# Patient Record
Sex: Female | Born: 1985
Health system: Southern US, Community
[De-identification: ages and names within clinical notes are randomized; demographics above are authoritative.]

## PROBLEM LIST (undated history)

## (undated) DIAGNOSIS — F419 Anxiety disorder, unspecified: Secondary | ICD-10-CM

## (undated) DIAGNOSIS — Z87442 Personal history of urinary calculi: Secondary | ICD-10-CM

## (undated) DIAGNOSIS — F329 Major depressive disorder, single episode, unspecified: Secondary | ICD-10-CM

## (undated) DIAGNOSIS — Z9889 Other specified postprocedural states: Secondary | ICD-10-CM

## (undated) DIAGNOSIS — R112 Nausea with vomiting, unspecified: Secondary | ICD-10-CM

## (undated) DIAGNOSIS — Z960 Presence of urogenital implants: Secondary | ICD-10-CM

## (undated) DIAGNOSIS — Z8489 Family history of other specified conditions: Secondary | ICD-10-CM

## (undated) DIAGNOSIS — R3915 Urgency of urination: Secondary | ICD-10-CM

## (undated) DIAGNOSIS — G4700 Insomnia, unspecified: Secondary | ICD-10-CM

## (undated) DIAGNOSIS — R300 Dysuria: Secondary | ICD-10-CM

## (undated) DIAGNOSIS — N201 Calculus of ureter: Secondary | ICD-10-CM

## (undated) DIAGNOSIS — R350 Frequency of micturition: Secondary | ICD-10-CM

## (undated) DIAGNOSIS — F32A Depression, unspecified: Secondary | ICD-10-CM

## (undated) DIAGNOSIS — I10 Essential (primary) hypertension: Secondary | ICD-10-CM

## (undated) HISTORY — PX: WISDOM TOOTH EXTRACTION: SHX21

## (undated) HISTORY — DX: Insomnia, unspecified: G47.00

## (undated) HISTORY — DX: Depression, unspecified: F32.A

## (undated) HISTORY — DX: Anxiety disorder, unspecified: F41.9

## (undated) HISTORY — DX: Essential (primary) hypertension: I10

## (undated) HISTORY — DX: Major depressive disorder, single episode, unspecified: F32.9

---
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
## 2003-12-08 ENCOUNTER — Other Ambulatory Visit: Admission: RE | Admit: 2003-12-08 | Discharge: 2003-12-08 | Payer: Self-pay | Admitting: Family Medicine

## 2005-01-22 ENCOUNTER — Other Ambulatory Visit: Admission: RE | Admit: 2005-01-22 | Discharge: 2005-01-22 | Payer: Self-pay | Admitting: *Deleted

## 2006-03-17 ENCOUNTER — Other Ambulatory Visit: Admission: RE | Admit: 2006-03-17 | Discharge: 2006-03-17 | Payer: Self-pay | Admitting: *Deleted

## 2006-07-29 HISTORY — PX: BREAST ENHANCEMENT SURGERY: SHX7

## 2008-07-29 DIAGNOSIS — Z87442 Personal history of urinary calculi: Secondary | ICD-10-CM

## 2008-07-29 HISTORY — DX: Personal history of urinary calculi: Z87.442

## 2009-04-18 ENCOUNTER — Emergency Department (HOSPITAL_COMMUNITY): Admission: EM | Admit: 2009-04-18 | Discharge: 2009-04-18 | Payer: Self-pay | Admitting: Emergency Medicine

## 2009-04-18 ENCOUNTER — Other Ambulatory Visit: Admission: RE | Admit: 2009-04-18 | Discharge: 2009-04-18 | Payer: Self-pay | Admitting: Family Medicine

## 2009-06-19 ENCOUNTER — Emergency Department (HOSPITAL_BASED_OUTPATIENT_CLINIC_OR_DEPARTMENT_OTHER): Admission: EM | Admit: 2009-06-19 | Discharge: 2009-06-19 | Payer: Self-pay | Admitting: Emergency Medicine

## 2009-06-19 ENCOUNTER — Ambulatory Visit: Payer: Self-pay | Admitting: Diagnostic Radiology

## 2009-11-03 ENCOUNTER — Emergency Department (HOSPITAL_BASED_OUTPATIENT_CLINIC_OR_DEPARTMENT_OTHER): Admission: EM | Admit: 2009-11-03 | Discharge: 2009-11-03 | Payer: Self-pay | Admitting: Emergency Medicine

## 2009-12-26 ENCOUNTER — Emergency Department (HOSPITAL_COMMUNITY): Admission: EM | Admit: 2009-12-26 | Discharge: 2009-12-26 | Payer: Self-pay | Admitting: Emergency Medicine

## 2010-02-04 ENCOUNTER — Emergency Department (HOSPITAL_COMMUNITY): Admission: EM | Admit: 2010-02-04 | Discharge: 2010-02-05 | Payer: Self-pay | Admitting: Emergency Medicine

## 2010-02-05 ENCOUNTER — Inpatient Hospital Stay (HOSPITAL_COMMUNITY): Admission: AD | Admit: 2010-02-05 | Discharge: 2010-02-08 | Payer: Self-pay | Admitting: Psychiatry

## 2010-02-05 ENCOUNTER — Ambulatory Visit: Payer: Self-pay | Admitting: Psychiatry

## 2010-06-09 ENCOUNTER — Ambulatory Visit: Payer: Self-pay | Admitting: Diagnostic Radiology

## 2010-06-09 ENCOUNTER — Emergency Department (HOSPITAL_BASED_OUTPATIENT_CLINIC_OR_DEPARTMENT_OTHER): Admission: EM | Admit: 2010-06-09 | Discharge: 2010-06-09 | Payer: Self-pay | Admitting: Emergency Medicine

## 2010-06-10 ENCOUNTER — Emergency Department (HOSPITAL_BASED_OUTPATIENT_CLINIC_OR_DEPARTMENT_OTHER): Admission: EM | Admit: 2010-06-10 | Discharge: 2010-06-11 | Payer: Self-pay | Admitting: Emergency Medicine

## 2010-08-26 ENCOUNTER — Emergency Department (INDEPENDENT_AMBULATORY_CARE_PROVIDER_SITE_OTHER)
Admission: EM | Admit: 2010-08-26 | Discharge: 2010-08-26 | Payer: Self-pay | Source: Home / Self Care | Admitting: Emergency Medicine

## 2010-08-26 DIAGNOSIS — R109 Unspecified abdominal pain: Secondary | ICD-10-CM

## 2010-08-26 DIAGNOSIS — R112 Nausea with vomiting, unspecified: Secondary | ICD-10-CM

## 2010-08-26 LAB — BASIC METABOLIC PANEL
Calcium: 8.6 mg/dL (ref 8.4–10.5)
Creatinine, Ser: 0.6 mg/dL (ref 0.4–1.2)
GFR calc Af Amer: >60 mL/min (ref >60)
GFR calc non Af Amer: >60 mL/min (ref >60)

## 2010-08-26 LAB — URINALYSIS, ROUTINE W REFLEX MICROSCOPIC
Ketones, ur: NEGATIVE mg/dL
Nitrite: NEGATIVE
Protein, ur: NEGATIVE mg/dL

## 2010-08-26 LAB — PREGNANCY, URINE: Preg Test, Ur: NEGATIVE

## 2010-10-09 LAB — URINALYSIS, ROUTINE W REFLEX MICROSCOPIC
Bilirubin Urine: NEGATIVE
Glucose, UA: NEGATIVE mg/dL
Hgb urine dipstick: NEGATIVE
Hgb urine dipstick: NEGATIVE
Protein, ur: NEGATIVE mg/dL
Specific Gravity, Urine: 1.006 (ref 1.005–1.030)
Urobilinogen, UA: 0.2 mg/dL (ref 0.0–1.0)
pH: 6.5 (ref 5.0–8.0)

## 2010-10-09 LAB — CBC
Hemoglobin: 13.5 g/dL (ref 12.0–15.0)
Hemoglobin: 14.6 g/dL (ref 12.0–15.0)
MCH: 31.5 pg (ref 26.0–34.0)
MCH: 31.6 pg (ref 26.0–34.0)
MCHC: 35.3 g/dL (ref 30.0–36.0)
RBC: 4.64 MIL/uL (ref 3.87–5.11)
RDW: 12.4 % (ref 11.5–15.5)
WBC: 13.5 10*3/uL — ABNORMAL HIGH (ref 4.0–10.5)

## 2010-10-09 LAB — URINE MICROSCOPIC-ADD ON

## 2010-10-09 LAB — BASIC METABOLIC PANEL
BUN: 20 mg/dL (ref 6–23)
CO2: 24 mEq/L (ref 19–32)
CO2: 25 mEq/L (ref 19–32)
Calcium: 9.3 mg/dL (ref 8.4–10.5)
Calcium: 9.6 mg/dL (ref 8.4–10.5)
GFR calc Af Amer: >60 mL/min (ref >60)
GFR calc non Af Amer: >60 mL/min (ref >60)
Glucose, Bld: 91 mg/dL (ref 70–99)
Sodium: 144 mEq/L (ref 135–145)

## 2010-10-09 LAB — DIFFERENTIAL
Basophils Absolute: 0.2 10*3/uL — ABNORMAL HIGH (ref 0.0–0.1)
Basophils Relative: 1 % (ref 0–1)
Eosinophils Absolute: 0.1 10*3/uL (ref 0.0–0.7)
Eosinophils Absolute: 0.2 10*3/uL (ref 0.0–0.7)
Lymphocytes Relative: 12 % (ref 12–46)
Lymphocytes Relative: 17 % (ref 12–46)
Lymphs Abs: 2.3 10*3/uL (ref 0.7–4.0)
Monocytes Absolute: 1.9 10*3/uL — ABNORMAL HIGH (ref 0.1–1.0)
Monocytes Relative: 8 % (ref 3–12)
Neutro Abs: 14.2 10*3/uL — ABNORMAL HIGH (ref 1.7–7.7)
Neutro Abs: 9.8 10*3/uL — ABNORMAL HIGH (ref 1.7–7.7)
Neutrophils Relative %: 73 % (ref 43–77)
Neutrophils Relative %: 76 % (ref 43–77)

## 2010-10-09 LAB — URINE CULTURE
Colony Count: NO GROWTH
Culture  Setup Time: 201111142133
Culture: NO GROWTH

## 2010-10-09 LAB — MONONUCLEOSIS SCREEN: Mono Screen: NEGATIVE

## 2010-10-14 LAB — DIFFERENTIAL
Basophils Absolute: 0 10*3/uL (ref 0.0–0.1)
Basophils Relative: 0 % (ref 0–1)
Eosinophils Absolute: 0.1 10*3/uL (ref 0.0–0.7)
Eosinophils Relative: 0 % (ref 0–5)
Monocytes Absolute: 0.9 10*3/uL (ref 0.1–1.0)

## 2010-10-14 LAB — URINALYSIS, ROUTINE W REFLEX MICROSCOPIC
Glucose, UA: NEGATIVE mg/dL
Protein, ur: NEGATIVE mg/dL
Specific Gravity, Urine: 1.014 (ref 1.005–1.030)
pH: 7 (ref 5.0–8.0)

## 2010-10-14 LAB — BASIC METABOLIC PANEL
BUN: 9 mg/dL (ref 6–23)
Chloride: 105 mEq/L (ref 96–112)
Creatinine, Ser: 0.75 mg/dL (ref 0.4–1.2)
Glucose, Bld: 93 mg/dL (ref 70–99)
Potassium: 3.8 mEq/L (ref 3.5–5.1)

## 2010-10-14 LAB — CBC
HCT: 43.4 % (ref 36.0–46.0)
MCH: 33.2 pg (ref 26.0–34.0)
MCHC: 35.6 g/dL (ref 30.0–36.0)
MCV: 93.1 fL (ref 78.0–100.0)
Platelets: 560 10*3/uL — ABNORMAL HIGH (ref 150–400)
RDW: 12.5 % (ref 11.5–15.5)
WBC: 11.7 10*3/uL — ABNORMAL HIGH (ref 4.0–10.5)

## 2010-10-14 LAB — PREGNANCY, URINE: Preg Test, Ur: NEGATIVE

## 2010-10-14 LAB — RAPID URINE DRUG SCREEN, HOSP PERFORMED
Barbiturates: NOT DETECTED
Benzodiazepines: NOT DETECTED

## 2010-10-14 LAB — URINE MICROSCOPIC-ADD ON

## 2010-10-14 LAB — ETHANOL: Alcohol, Ethyl (B): 5 mg/dL (ref 0–10)

## 2010-10-15 LAB — POCT I-STAT, CHEM 8
Chloride: 112 mEq/L (ref 96–112)
Creatinine, Ser: 0.6 mg/dL (ref 0.4–1.2)
Glucose, Bld: 119 mg/dL — ABNORMAL HIGH (ref 70–99)
HCT: 46 % (ref 36.0–46.0)
Hemoglobin: 15.6 g/dL — ABNORMAL HIGH (ref 12.0–15.0)
Potassium: 4 mEq/L (ref 3.5–5.1)
Sodium: 143 mEq/L (ref 135–145)

## 2010-10-15 LAB — URINALYSIS, ROUTINE W REFLEX MICROSCOPIC
Glucose, UA: NEGATIVE mg/dL
Hgb urine dipstick: NEGATIVE
Specific Gravity, Urine: 1.025 (ref 1.005–1.030)
pH: 5.5 (ref 5.0–8.0)

## 2010-10-15 LAB — URINE MICROSCOPIC-ADD ON

## 2010-10-15 LAB — CBC
Hemoglobin: 15.1 g/dL — ABNORMAL HIGH (ref 12.0–15.0)
MCHC: 34.6 g/dL (ref 30.0–36.0)
RBC: 4.64 MIL/uL (ref 3.87–5.11)
RDW: 12.9 % (ref 11.5–15.5)

## 2010-10-15 LAB — RAPID URINE DRUG SCREEN, HOSP PERFORMED: Tetrahydrocannabinol: POSITIVE — AB

## 2010-10-15 LAB — ETHANOL: Alcohol, Ethyl (B): <5 mg/dL (ref 0–10)

## 2010-10-15 LAB — POCT PREGNANCY, URINE: Preg Test, Ur: NEGATIVE

## 2010-10-17 LAB — URINE CULTURE: Colony Count: NO GROWTH

## 2010-10-17 LAB — URINE MICROSCOPIC-ADD ON

## 2010-10-17 LAB — BASIC METABOLIC PANEL
BUN: 7 mg/dL (ref 6–23)
CO2: 22 mEq/L (ref 19–32)
Calcium: 9.1 mg/dL (ref 8.4–10.5)
Chloride: 107 mEq/L (ref 96–112)
Creatinine, Ser: 0.7 mg/dL (ref 0.4–1.2)
Glucose, Bld: 86 mg/dL (ref 70–99)

## 2010-10-17 LAB — CBC
MCHC: 34.6 g/dL (ref 30.0–36.0)
MCV: 92.1 fL (ref 78.0–100.0)
Platelets: 387 10*3/uL (ref 150–400)
RBC: 4.33 MIL/uL (ref 3.87–5.11)
RDW: 11.4 % — ABNORMAL LOW (ref 11.5–15.5)

## 2010-10-17 LAB — DIFFERENTIAL
Basophils Absolute: 0 10*3/uL (ref 0.0–0.1)
Basophils Relative: 0 % (ref 0–1)
Eosinophils Absolute: 0 10*3/uL (ref 0.0–0.7)
Monocytes Relative: 6 % (ref 3–12)
Neutro Abs: 9.4 10*3/uL — ABNORMAL HIGH (ref 1.7–7.7)
Neutrophils Relative %: 77 % (ref 43–77)

## 2010-10-17 LAB — URINALYSIS, ROUTINE W REFLEX MICROSCOPIC
Hgb urine dipstick: NEGATIVE
Protein, ur: 30 mg/dL — AB
Specific Gravity, Urine: 1.022 (ref 1.005–1.030)
Urobilinogen, UA: 0.2 mg/dL (ref 0.0–1.0)

## 2010-10-31 LAB — PREGNANCY, URINE: Preg Test, Ur: NEGATIVE

## 2010-10-31 LAB — URINE CULTURE

## 2010-10-31 LAB — URINALYSIS, ROUTINE W REFLEX MICROSCOPIC
Bilirubin Urine: NEGATIVE
Glucose, UA: NEGATIVE mg/dL
Nitrite: NEGATIVE
Specific Gravity, Urine: 1.019 (ref 1.005–1.030)
pH: 7 (ref 5.0–8.0)

## 2010-10-31 LAB — URINE MICROSCOPIC-ADD ON

## 2010-11-02 LAB — DIFFERENTIAL
Eosinophils Absolute: 0 10*3/uL (ref 0.0–0.7)
Eosinophils Relative: 0 % (ref 0–5)
Lymphocytes Relative: 2 % — ABNORMAL LOW (ref 12–46)
Lymphs Abs: 0.4 10*3/uL — ABNORMAL LOW (ref 0.7–4.0)
Monocytes Relative: 1 % — ABNORMAL LOW (ref 3–12)

## 2010-11-02 LAB — CBC
HCT: 43.1 % (ref 36.0–46.0)
Hemoglobin: 15.3 g/dL — ABNORMAL HIGH (ref 12.0–15.0)
MCV: 90.7 fL (ref 78.0–100.0)
RBC: 4.75 MIL/uL (ref 3.87–5.11)
WBC: 17.1 10*3/uL — ABNORMAL HIGH (ref 4.0–10.5)

## 2010-11-02 LAB — BASIC METABOLIC PANEL
Chloride: 104 mEq/L (ref 96–112)
GFR calc Af Amer: >60 mL/min (ref >60)
GFR calc non Af Amer: >60 mL/min (ref >60)
Potassium: 3.5 mEq/L (ref 3.5–5.1)
Sodium: 137 mEq/L (ref 135–145)

## 2010-11-02 LAB — URINE MICROSCOPIC-ADD ON

## 2010-11-02 LAB — URINALYSIS, ROUTINE W REFLEX MICROSCOPIC
Specific Gravity, Urine: 1.02 (ref 1.005–1.030)
Urobilinogen, UA: 0.2 mg/dL (ref 0.0–1.0)
pH: 7 (ref 5.0–8.0)

## 2010-11-02 LAB — PREGNANCY, URINE: Preg Test, Ur: NEGATIVE

## 2010-11-03 ENCOUNTER — Emergency Department (HOSPITAL_BASED_OUTPATIENT_CLINIC_OR_DEPARTMENT_OTHER)
Admission: EM | Admit: 2010-11-03 | Discharge: 2010-11-03 | Disposition: A | Discharge status: Home / Self Care | Payer: BC Managed Care – PPO | Attending: Emergency Medicine | Admitting: Emergency Medicine

## 2010-11-03 ENCOUNTER — Emergency Department (INDEPENDENT_AMBULATORY_CARE_PROVIDER_SITE_OTHER): Payer: BC Managed Care – PPO

## 2010-11-03 DIAGNOSIS — R319 Hematuria, unspecified: Secondary | ICD-10-CM | POA: Insufficient documentation

## 2010-11-03 DIAGNOSIS — N12 Tubulo-interstitial nephritis, not specified as acute or chronic: Secondary | ICD-10-CM | POA: Insufficient documentation

## 2010-11-03 DIAGNOSIS — F341 Dysthymic disorder: Secondary | ICD-10-CM | POA: Insufficient documentation

## 2010-11-03 DIAGNOSIS — R109 Unspecified abdominal pain: Secondary | ICD-10-CM

## 2010-11-03 DIAGNOSIS — F988 Other specified behavioral and emotional disorders with onset usually occurring in childhood and adolescence: Secondary | ICD-10-CM | POA: Insufficient documentation

## 2010-11-03 DIAGNOSIS — Z79899 Other long term (current) drug therapy: Secondary | ICD-10-CM | POA: Insufficient documentation

## 2010-11-03 LAB — CBC
Hemoglobin: 14.2 g/dL (ref 12.0–15.0)
Platelets: 424 10*3/uL — ABNORMAL HIGH (ref 150–400)
RBC: 4.57 MIL/uL (ref 3.87–5.11)
WBC: 17.2 10*3/uL — ABNORMAL HIGH (ref 4.0–10.5)

## 2010-11-03 LAB — URINALYSIS, ROUTINE W REFLEX MICROSCOPIC
Glucose, UA: NEGATIVE mg/dL
Specific Gravity, Urine: 1.027 (ref 1.005–1.030)
Urobilinogen, UA: 0.2 mg/dL (ref 0.0–1.0)

## 2010-11-03 LAB — URINE MICROSCOPIC-ADD ON

## 2010-11-03 LAB — BASIC METABOLIC PANEL
CO2: 26 mEq/L (ref 19–32)
Chloride: 105 mEq/L (ref 96–112)
GFR calc Af Amer: >60 mL/min (ref >60)
Potassium: 3.9 mEq/L (ref 3.5–5.1)
Sodium: 142 mEq/L (ref 135–145)

## 2010-11-03 LAB — DIFFERENTIAL
Basophils Absolute: 0.1 10*3/uL (ref 0.0–0.1)
Basophils Relative: 0 % (ref 0–1)
Eosinophils Absolute: 0.1 10*3/uL (ref 0.0–0.7)
Monocytes Relative: 9 % (ref 3–12)
Neutro Abs: 13.7 10*3/uL — ABNORMAL HIGH (ref 1.7–7.7)
Neutrophils Relative %: 80 % — ABNORMAL HIGH (ref 43–77)

## 2010-11-03 LAB — PREGNANCY, URINE: Preg Test, Ur: NEGATIVE

## 2010-11-05 LAB — URINE CULTURE: Culture  Setup Time: 201204080019

## 2010-12-14 NOTE — Op Note (Signed)
[facility]  Patient:    [name], [name]                        MRN: [id number] Proc. Date: 07/18/00 Adm. Date:  [id number] Attending:  Marlowe Kays Page                           Operative Report  PREOPERATIVE DIAGNOSIS:  Chronic lesion dorsum right long finger.  POSTOPERATIVE DIAGNOSIS:  Chronic lesion dorsum right long finger.  OPERATION:  Excision of lesion dorsum right long finger.  SURGEON:  Illene Labrador. [place], M.D.  ASSISTANT:  Nurse.  ANESTHESIA:  Regional.  PATHOLOGY AND JUSTIFICATION FOR PROCEDURE:  This lesion has been present for almost five months, no known injury.  She has had treatment by dermatologist without success in eradicating the lesion.  It is about 5 mm in diameter, has a verrucous appearance, and was midway between the DIP joint and the base of the nail.  I was prepared to apply a full thickness skin graft from the wrist as necessary.  DESCRIPTION OF PROCEDURE:  Satisfactory regional anesthesia, Dura-Prep from mid forearm to fingertips, draped in a sterile field.  The lesion was excised with a 7 mm elliptical excision.  I felt like I had completely excised the lesion which was superficial to the durum layer for the nail which I could see at the bottom of my excision area.  I was able to obtain a primary closure with 5-0 nylon rather than having to use a skin graft.  I blocked the finger with 1/2% plain Marcaine.  Betadine and Adaptic dry sterile dressing were applied.  At the time of this dictation, she was on her way to the recovery room in satisfactory condition with no known complications. DD:  07/18/00 TD:  07/19/00 Job: [id number]

## 2011-04-22 ENCOUNTER — Emergency Department (HOSPITAL_COMMUNITY)
Admission: EM | Admit: 2011-04-22 | Discharge: 2011-04-23 | Disposition: A | Discharge status: Other Acute Inpatient Hospital | Payer: BC Managed Care – PPO | Attending: Emergency Medicine | Admitting: Emergency Medicine

## 2011-04-22 DIAGNOSIS — R000 Tachycardia, unspecified: Secondary | ICD-10-CM | POA: Insufficient documentation

## 2011-04-22 DIAGNOSIS — F329 Major depressive disorder, single episode, unspecified: Secondary | ICD-10-CM | POA: Insufficient documentation

## 2011-04-22 DIAGNOSIS — F411 Generalized anxiety disorder: Secondary | ICD-10-CM | POA: Insufficient documentation

## 2011-04-22 DIAGNOSIS — F19939 Other psychoactive substance use, unspecified with withdrawal, unspecified: Secondary | ICD-10-CM | POA: Insufficient documentation

## 2011-04-22 DIAGNOSIS — F3289 Other specified depressive episodes: Secondary | ICD-10-CM | POA: Insufficient documentation

## 2011-04-22 DIAGNOSIS — F1120 Opioid dependence, uncomplicated: Secondary | ICD-10-CM | POA: Insufficient documentation

## 2011-04-22 DIAGNOSIS — Z79899 Other long term (current) drug therapy: Secondary | ICD-10-CM | POA: Insufficient documentation

## 2011-04-22 LAB — URINALYSIS, ROUTINE W REFLEX MICROSCOPIC
Bilirubin Urine: NEGATIVE
Glucose, UA: NEGATIVE mg/dL
Hgb urine dipstick: NEGATIVE
Ketones, ur: NEGATIVE mg/dL
Protein, ur: NEGATIVE mg/dL
pH: 6 (ref 5.0–8.0)

## 2011-04-22 LAB — COMPREHENSIVE METABOLIC PANEL
ALT: 14 U/L (ref 0–35)
AST: 18 U/L (ref 0–37)
Albumin: 4.5 g/dL (ref 3.5–5.2)
Alkaline Phosphatase: 130 U/L — ABNORMAL HIGH (ref 39–117)
CO2: 26 mEq/L (ref 19–32)
Chloride: 105 mEq/L (ref 96–112)
GFR calc non Af Amer: >60 mL/min (ref >60)
Potassium: 4.2 mEq/L (ref 3.5–5.1)
Total Bilirubin: 0.3 mg/dL (ref 0.3–1.2)

## 2011-04-22 LAB — DIFFERENTIAL
Basophils Absolute: 0.1 10*3/uL (ref 0.0–0.1)
Basophils Relative: 1 % (ref 0–1)
Eosinophils Relative: 1 % (ref 0–5)
Lymphocytes Relative: 24 % (ref 12–46)
Monocytes Absolute: 1.5 10*3/uL — ABNORMAL HIGH (ref 0.1–1.0)
Monocytes Relative: 11 % (ref 3–12)
Neutro Abs: 8.4 10*3/uL — ABNORMAL HIGH (ref 1.7–7.7)

## 2011-04-22 LAB — CBC
HCT: 46.6 % — ABNORMAL HIGH (ref 36.0–46.0)
Hemoglobin: 16.8 g/dL — ABNORMAL HIGH (ref 12.0–15.0)
MCH: 32.2 pg (ref 26.0–34.0)
MCHC: 36.1 g/dL — ABNORMAL HIGH (ref 30.0–36.0)
RDW: 12.5 % (ref 11.5–15.5)

## 2011-04-22 LAB — RAPID URINE DRUG SCREEN, HOSP PERFORMED
Barbiturates: NOT DETECTED
Cocaine: NOT DETECTED
Tetrahydrocannabinol: NOT DETECTED

## 2011-04-22 LAB — URINE MICROSCOPIC-ADD ON

## 2011-04-23 ENCOUNTER — Inpatient Hospital Stay (HOSPITAL_COMMUNITY)
Admission: RE | Admit: 2011-04-23 | Discharge: 2011-04-27 | DRG: 745 | Disposition: A | Discharge status: Home / Self Care | Payer: BC Managed Care – PPO | Source: Ambulatory Visit | Attending: Psychiatry | Admitting: Psychiatry

## 2011-04-23 DIAGNOSIS — F1920 Other psychoactive substance dependence, uncomplicated: Secondary | ICD-10-CM

## 2011-04-23 DIAGNOSIS — F1120 Opioid dependence, uncomplicated: Principal | ICD-10-CM

## 2011-04-23 DIAGNOSIS — Z880 Allergy status to penicillin: Secondary | ICD-10-CM

## 2011-04-23 DIAGNOSIS — Z87442 Personal history of urinary calculi: Secondary | ICD-10-CM

## 2011-04-23 DIAGNOSIS — Z560 Unemployment, unspecified: Secondary | ICD-10-CM

## 2011-04-23 DIAGNOSIS — F329 Major depressive disorder, single episode, unspecified: Secondary | ICD-10-CM

## 2011-04-23 DIAGNOSIS — Z6379 Other stressful life events affecting family and household: Secondary | ICD-10-CM

## 2011-04-23 DIAGNOSIS — F411 Generalized anxiety disorder: Secondary | ICD-10-CM

## 2011-04-23 DIAGNOSIS — F3289 Other specified depressive episodes: Secondary | ICD-10-CM

## 2011-04-23 DIAGNOSIS — Z882 Allergy status to sulfonamides status: Secondary | ICD-10-CM

## 2011-04-23 DIAGNOSIS — Z79899 Other long term (current) drug therapy: Secondary | ICD-10-CM

## 2011-04-23 LAB — POCT PREGNANCY, URINE: Preg Test, Ur: NEGATIVE
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
## 2012-10-08 ENCOUNTER — Ambulatory Visit (INDEPENDENT_AMBULATORY_CARE_PROVIDER_SITE_OTHER): Payer: BC Managed Care – PPO | Admitting: Family Medicine

## 2012-10-08 ENCOUNTER — Ambulatory Visit: Payer: BC Managed Care – PPO

## 2012-10-08 VITALS — BP 146/78 | HR 120 | Temp 98.6°F | Resp 16 | Ht 64.0 in | Wt 164.0 lb

## 2012-10-08 DIAGNOSIS — M549 Dorsalgia, unspecified: Secondary | ICD-10-CM

## 2012-10-08 DIAGNOSIS — S8000XA Contusion of unspecified knee, initial encounter: Secondary | ICD-10-CM

## 2012-10-08 DIAGNOSIS — M25569 Pain in unspecified knee: Secondary | ICD-10-CM

## 2012-10-08 DIAGNOSIS — M25572 Pain in left ankle and joints of left foot: Secondary | ICD-10-CM

## 2012-10-08 DIAGNOSIS — M25579 Pain in unspecified ankle and joints of unspecified foot: Secondary | ICD-10-CM

## 2012-10-08 DIAGNOSIS — S93409A Sprain of unspecified ligament of unspecified ankle, initial encounter: Secondary | ICD-10-CM

## 2012-10-08 DIAGNOSIS — M25562 Pain in left knee: Secondary | ICD-10-CM

## 2012-10-08 DIAGNOSIS — S8002XA Contusion of left knee, initial encounter: Secondary | ICD-10-CM

## 2012-10-08 MED ORDER — DICLOFENAC SODIUM 75 MG PO TBEC: 75.0000 mg | DELAYED_RELEASE_TABLET | Freq: Two times a day (BID) | ORAL | Status: DC

## 2012-10-08 NOTE — Progress Notes (Signed)
Subjective:    Patient ID: [name], female    DOB: 1986/02/26, 27 y.o.   MRN: [id number]  HPI 27 year old female presents for evaluation of left knee and ankle pain s/p a fall this morning around 9:00 a.m.  States she was on her way in to work at [employer] and tripped off of the curb and fell hitting her left knee and possible rolling her left ankle.  She denies head injury or LOC.  Admits that she did try to continue on with her work day but subsequently decided her pain was too much and that she needed to be checked out.  Is able to ambulate but has significant pain in her left knee with weight bearing. Also complains of lumbar back pain although she did not have a direct injury to her back. Denies paresthesias, weakness, incontinence, or saddle anesthesias.  Denies headache, dizziness, nausea, or vomiting.     Review of Systems  Constitutional: Negative for fever and chills.  Gastrointestinal: Negative for nausea, vomiting and abdominal pain.  Musculoskeletal: Positive for back pain, joint swelling (left knee) and arthralgias (left knee and ankle).  Neurological: Negative for dizziness and headaches.       Objective:   Physical Exam  Constitutional: She is oriented to person, place, and time. She appears well-developed and well-nourished.  HENT:  Head: Normocephalic and atraumatic.  Right Ear: External ear normal.  Eyes: Conjunctivae are normal.  Neck: Normal range of motion.  Cardiovascular: Normal rate.   Pulmonary/Chest: Effort normal.  Musculoskeletal:       Left knee: She exhibits decreased range of motion and swelling. She exhibits no ecchymosis, no deformity, no erythema, normal alignment, no LCL laxity and no bony tenderness. Tenderness found. Medial joint line and lateral joint line tenderness noted. No MCL, no LCL and no patellar tendon tenderness noted.       Left ankle: She exhibits normal range of motion, no swelling, no ecchymosis and normal pulse. Tenderness (over  anterior ankle). No lateral malleolus and no medial malleolus tenderness found. Achilles tendon normal.       Lumbar back: She exhibits decreased range of motion (secondary to pain), tenderness (bilateral sciatic tenderness) and pain. She exhibits no bony tenderness (no midline tenderness), no swelling and no deformity.       Legs: Neurological: She is alert and oriented to person, place, and time. She has normal strength.  Reflex Scores:      Patellar reflexes are 2+ on the right side and 2+ on the left side. Psychiatric: She has a normal mood and affect. Her behavior is normal. Judgment and thought content normal.     [facility] reading (PRIMARY) by  Dr. Alwyn Ren as norma lumbar spine, negative knee x-ray, and negative ankle x-ray.       Assessment & Plan:  Contusion, knee, left, initial encounter  Sprain of ankle, unspecified site  Knee pain, acute, left - Plan: DG Knee Complete 4 Views Left, diclofenac (VOLTAREN) 75 MG EC tablet  Pain in joint, ankle and foot, left - Plan: DG Ankle Complete Left  Backache - Plan: DG Lumbar Spine Complete  Patient has negative x-rays here today.  She has a contusion of her left knee as well as a left ankle sprain.  When I went back into the room to discuss her findings, she at first declined anti-inflammatory prescription but did ask for a prescription for Vicodin.  According to [place] Controlled Substance Database, patient is currently on Suboxone treatment  with Dr. Geoffery Lyons.  I refused to prescribe Narcotics for her. She then demanded a prescription for a muscle relaxer. I discussed at length with her that I did not believe a muscle relaxer was appropriate treatment for the type of injuries she had. She adamantly insisted that she would need the muscle relaxer "to sleep" and "to relax" tomorrow when she was at home from work.  I again explained this was not appropriate use of this medication.  Patient was discharged home with a knee brace and a prescription for  Diclofenac to take twice daily.

## 2012-10-08 NOTE — Progress Notes (Signed)
X-rays were carefully examined and I discussed the case with Rhoderick Moody PA . Agree with treatment and plan

## 2016-11-28 ENCOUNTER — Emergency Department (HOSPITAL_COMMUNITY): Payer: Self-pay

## 2016-11-28 ENCOUNTER — Emergency Department (HOSPITAL_COMMUNITY)
Admission: EM | Admit: 2016-11-28 | Discharge: 2016-11-28 | Disposition: A | Discharge status: Home / Self Care | Payer: Self-pay | Attending: Urology | Admitting: Urology

## 2016-11-28 ENCOUNTER — Ambulatory Visit: Payer: Self-pay | Admitting: Urology

## 2016-11-28 ENCOUNTER — Encounter (HOSPITAL_COMMUNITY)
Admission: EM | Disposition: A | Discharge status: Home / Self Care | Payer: Self-pay | Source: Home / Self Care | Attending: Emergency Medicine

## 2016-11-28 ENCOUNTER — Encounter (HOSPITAL_COMMUNITY): Payer: Self-pay

## 2016-11-28 ENCOUNTER — Emergency Department (HOSPITAL_COMMUNITY): Payer: Self-pay | Admitting: Anesthesiology

## 2016-11-28 DIAGNOSIS — G4700 Insomnia, unspecified: Secondary | ICD-10-CM | POA: Insufficient documentation

## 2016-11-28 DIAGNOSIS — N390 Urinary tract infection, site not specified: Secondary | ICD-10-CM | POA: Insufficient documentation

## 2016-11-28 DIAGNOSIS — Z793 Long term (current) use of hormonal contraceptives: Secondary | ICD-10-CM | POA: Insufficient documentation

## 2016-11-28 DIAGNOSIS — F329 Major depressive disorder, single episode, unspecified: Secondary | ICD-10-CM | POA: Insufficient documentation

## 2016-11-28 DIAGNOSIS — I10 Essential (primary) hypertension: Secondary | ICD-10-CM | POA: Insufficient documentation

## 2016-11-28 DIAGNOSIS — N200 Calculus of kidney: Secondary | ICD-10-CM

## 2016-11-28 DIAGNOSIS — N132 Hydronephrosis with renal and ureteral calculous obstruction: Secondary | ICD-10-CM | POA: Insufficient documentation

## 2016-11-28 DIAGNOSIS — Z841 Family history of disorders of kidney and ureter: Secondary | ICD-10-CM | POA: Insufficient documentation

## 2016-11-28 DIAGNOSIS — F17210 Nicotine dependence, cigarettes, uncomplicated: Secondary | ICD-10-CM | POA: Insufficient documentation

## 2016-11-28 DIAGNOSIS — Z79899 Other long term (current) drug therapy: Secondary | ICD-10-CM | POA: Insufficient documentation

## 2016-11-28 DIAGNOSIS — N23 Unspecified renal colic: Secondary | ICD-10-CM

## 2016-11-28 DIAGNOSIS — N201 Calculus of ureter: Secondary | ICD-10-CM

## 2016-11-28 HISTORY — PX: CYSTOSCOPY W/ URETERAL STENT PLACEMENT: SHX1429

## 2016-11-28 LAB — URINALYSIS, ROUTINE W REFLEX MICROSCOPIC
BILIRUBIN URINE: NEGATIVE
Bacteria, UA: NONE SEEN
Glucose, UA: NEGATIVE mg/dL
Hgb urine dipstick: NEGATIVE
Ketones, ur: 20 mg/dL — AB
Leukocytes, UA: NEGATIVE
Nitrite: POSITIVE — AB
PH: 5 (ref 5.0–8.0)
Protein, ur: 100 mg/dL — AB
SPECIFIC GRAVITY, URINE: 1.033 — AB (ref 1.005–1.030)

## 2016-11-28 LAB — BASIC METABOLIC PANEL
Anion gap: 9 (ref 5–15)
BUN: 15 mg/dL (ref 6–20)
CO2: 27 mmol/L (ref 22–32)
Calcium: 9.2 mg/dL (ref 8.9–10.3)
Chloride: 103 mmol/L (ref 101–111)
Creatinine, Ser: 0.86 mg/dL (ref 0.44–1.00)
GFR calc Af Amer: >60 mL/min (ref >60)
Glucose, Bld: 126 mg/dL — ABNORMAL HIGH (ref 65–99)
POTASSIUM: 3.9 mmol/L (ref 3.5–5.1)
SODIUM: 139 mmol/L (ref 135–145)

## 2016-11-28 LAB — CBC WITH DIFFERENTIAL/PLATELET
Basophils Absolute: 0.1 10*3/uL (ref 0.0–0.1)
Basophils Relative: 0 %
EOS ABS: 0.1 10*3/uL (ref 0.0–0.7)
EOS PCT: 1 %
HCT: 40.3 % (ref 36.0–46.0)
Hemoglobin: 14.2 g/dL (ref 12.0–15.0)
LYMPHS ABS: 1.2 10*3/uL (ref 0.7–4.0)
Lymphocytes Relative: 9 %
MCH: 31.2 pg (ref 26.0–34.0)
MCHC: 35.2 g/dL (ref 30.0–36.0)
MCV: 88.6 fL (ref 78.0–100.0)
Monocytes Absolute: 1.3 10*3/uL — ABNORMAL HIGH (ref 0.1–1.0)
Monocytes Relative: 10 %
Neutro Abs: 10.6 10*3/uL — ABNORMAL HIGH (ref 1.7–7.7)
Neutrophils Relative %: 80 %
PLATELETS: 348 10*3/uL (ref 150–400)
RBC: 4.55 MIL/uL (ref 3.87–5.11)
RDW: 12.4 % (ref 11.5–15.5)
WBC: 13.2 10*3/uL — AB (ref 4.0–10.5)

## 2016-11-28 LAB — POC URINE PREG, ED: Preg Test, Ur: NEGATIVE

## 2016-11-28 SURGERY — CYSTOSCOPY, WITH RETROGRADE PYELOGRAM AND URETERAL STENT INSERTION
Anesthesia: General | Laterality: Left

## 2016-11-28 MED ORDER — MIDAZOLAM HCL 2 MG/2ML IJ SOLN
INTRAMUSCULAR | Status: DC | PRN
  Administered 2016-11-28: 2 mg via INTRAVENOUS

## 2016-11-28 MED ORDER — FENTANYL CITRATE (PF) 100 MCG/2ML IJ SOLN
INTRAMUSCULAR | Status: DC | PRN
  Administered 2016-11-28 (×4): 25 ug via INTRAVENOUS

## 2016-11-28 MED ORDER — ONDANSETRON HCL 4 MG/2ML IJ SOLN
4.0000 mg | Freq: Once | INTRAMUSCULAR | Status: DC
  Filled 2016-11-28: qty 2

## 2016-11-28 MED ORDER — ROCURONIUM BROMIDE 100 MG/10ML IV SOLN
INTRAVENOUS | Status: DC | PRN
  Administered 2016-11-28: 5 mg via INTRAVENOUS

## 2016-11-28 MED ORDER — DIPHENHYDRAMINE HCL 50 MG/ML IJ SOLN
25.0000 mg | Freq: Once | INTRAMUSCULAR | Status: AC
Start: 2016-11-28 — End: 2016-11-28
  Administered 2016-11-28: 25 mg via INTRAMUSCULAR
  Filled 2016-11-28: qty 1

## 2016-11-28 MED ORDER — HYDROMORPHONE HCL 1 MG/ML IJ SOLN
1.0000 mg | Freq: Once | INTRAMUSCULAR | Status: AC
  Administered 2016-11-28: 1 mg via INTRAMUSCULAR
  Filled 2016-11-28: qty 1

## 2016-11-28 MED ORDER — DIATRIZOATE MEGLUMINE 30 % UR SOLN
URETHRAL | Status: AC
  Filled 2016-11-28: qty 300

## 2016-11-28 MED ORDER — ONDANSETRON HCL 4 MG/2ML IJ SOLN
INTRAMUSCULAR | Status: AC
  Filled 2016-11-28: qty 2

## 2016-11-28 MED ORDER — SODIUM CHLORIDE 0.9 % IV SOLN
INTRAVENOUS | Status: DC | PRN
  Administered 2016-11-28: 06:00:00 via INTRAVENOUS

## 2016-11-28 MED ORDER — ONDANSETRON 8 MG PO TBDP
8.0000 mg | ORAL_TABLET | Freq: Once | ORAL | Status: AC
  Administered 2016-11-28: 8 mg via ORAL
  Filled 2016-11-28: qty 1

## 2016-11-28 MED ORDER — MIDAZOLAM HCL 2 MG/2ML IJ SOLN
INTRAMUSCULAR | Status: AC
  Filled 2016-11-28: qty 2

## 2016-11-28 MED ORDER — LIDOCAINE HCL (PF) 1 % IJ SOLN
INTRAMUSCULAR | Status: AC
  Administered 2016-11-28: 03:00:00
  Filled 2016-11-28: qty 5

## 2016-11-28 MED ORDER — HYDROMORPHONE HCL 1 MG/ML IJ SOLN
1.0000 mg | Freq: Once | INTRAMUSCULAR | Status: AC
  Administered 2016-11-28: 1 mg via INTRAVENOUS
  Filled 2016-11-28: qty 1

## 2016-11-28 MED ORDER — METOCLOPRAMIDE HCL 5 MG/ML IJ SOLN
10.0000 mg | Freq: Once | INTRAMUSCULAR | Status: AC
Start: 2016-11-28 — End: 2016-11-28
  Administered 2016-11-28: 10 mg via INTRAMUSCULAR
  Filled 2016-11-28: qty 2

## 2016-11-28 MED ORDER — CEFTRIAXONE SODIUM 1 G IJ SOLR
1.0000 g | Freq: Once | INTRAMUSCULAR | Status: AC
  Administered 2016-11-28: 1 g via INTRAMUSCULAR
  Filled 2016-11-28: qty 10

## 2016-11-28 MED ORDER — DEXTROSE 5 % IV SOLN
INTRAVENOUS | Status: AC
  Filled 2016-11-28: qty 10

## 2016-11-28 MED ORDER — OXYBUTYNIN CHLORIDE 5 MG PO TABS: 5.0000 mg | ORAL_TABLET | Freq: Three times a day (TID) | ORAL | 1 refills | Status: DC | PRN

## 2016-11-28 MED ORDER — ROCURONIUM BROMIDE 50 MG/5ML IV SOLN
INTRAVENOUS | Status: AC
  Filled 2016-11-28: qty 1

## 2016-11-28 MED ORDER — MORPHINE SULFATE (PF) 4 MG/ML IV SOLN
4.0000 mg | Freq: Once | INTRAVENOUS | Status: DC
  Filled 2016-11-28: qty 1

## 2016-11-28 MED ORDER — STERILE WATER FOR IRRIGATION IR SOLN
Status: DC | PRN
  Administered 2016-11-28: 1000 mL

## 2016-11-28 MED ORDER — STERILE WATER FOR IRRIGATION IR SOLN
Status: DC | PRN
  Administered 2016-11-28: 3000 mL

## 2016-11-28 MED ORDER — SUCCINYLCHOLINE CHLORIDE 20 MG/ML IJ SOLN
INTRAMUSCULAR | Status: DC | PRN
  Administered 2016-11-28: 120 mg via INTRAVENOUS

## 2016-11-28 MED ORDER — ONDANSETRON HCL 4 MG/2ML IJ SOLN
INTRAMUSCULAR | Status: DC | PRN
  Administered 2016-11-28: 4 mg via INTRAVENOUS

## 2016-11-28 MED ORDER — PROPOFOL 10 MG/ML IV BOLUS
INTRAVENOUS | Status: DC | PRN
  Administered 2016-11-28: 150 mg via INTRAVENOUS
  Administered 2016-11-28: 50 mg via INTRAVENOUS

## 2016-11-28 MED ORDER — FENTANYL CITRATE (PF) 100 MCG/2ML IJ SOLN
INTRAMUSCULAR | Status: AC
  Filled 2016-11-28: qty 2

## 2016-11-28 MED ORDER — CEFTRIAXONE SODIUM 1 G IJ SOLR: 1.0000 g | Freq: Once | INTRAMUSCULAR | Status: DC

## 2016-11-28 MED ORDER — DIATRIZOATE MEGLUMINE 30 % UR SOLN
URETHRAL | Status: DC | PRN
  Administered 2016-11-28: 10 mL

## 2016-11-28 MED ORDER — CIPROFLOXACIN HCL 250 MG PO TABS: 500.0000 mg | ORAL_TABLET | Freq: Two times a day (BID) | ORAL | 0 refills | Status: DC

## 2016-11-28 MED ORDER — LIDOCAINE HCL (CARDIAC) 10 MG/ML IV SOLN
INTRAVENOUS | Status: DC | PRN
  Administered 2016-11-28: 50 mg via INTRAVENOUS

## 2016-11-28 SURGICAL SUPPLY — 21 items
BAG DRAIN URO TABLE W/ADPT NS (DRAPE) ×4 IMPLANT
BAG HAMPER (MISCELLANEOUS) ×4 IMPLANT
CATH INTERMIT  6FR 70CM (CATHETERS) ×4 IMPLANT
CLOTH BEACON ORANGE TIMEOUT ST (SAFETY) ×4 IMPLANT
GLOVE BIOGEL M 8.0 STRL (GLOVE) ×4 IMPLANT
GLOVE BIOGEL PI IND STRL 6.5 (GLOVE) ×2 IMPLANT
GLOVE BIOGEL PI IND STRL 7.0 (GLOVE) ×4 IMPLANT
GLOVE BIOGEL PI INDICATOR 6.5 (GLOVE) ×2
GLOVE BIOGEL PI INDICATOR 7.0 (GLOVE) ×4
GLOVE ECLIPSE 6.5 STRL STRAW (GLOVE) ×4 IMPLANT
GOWN STRL REUS W/ TWL LRG LVL3 (GOWN DISPOSABLE) ×2 IMPLANT
GOWN STRL REUS W/TWL LRG LVL3 (GOWN DISPOSABLE) ×2
GOWN STRL REUS W/TWL XL LVL3 (GOWN DISPOSABLE) ×4 IMPLANT
GUIDEWIRE ANG ZIPWIRE 038X150 (WIRE) ×4 IMPLANT
GUIDEWIRE STR DUAL SENSOR (WIRE) IMPLANT
KIT ROOM TURNOVER AP CYSTO (KITS) ×4 IMPLANT
MANIFOLD NEPTUNE II (INSTRUMENTS) ×4 IMPLANT
PACK CYSTO (CUSTOM PROCEDURE TRAY) ×4 IMPLANT
PAD ARMBOARD 7.5X6 YLW CONV (MISCELLANEOUS) ×4 IMPLANT
WATER STERILE IRR 1000ML POUR (IV SOLUTION) ×4 IMPLANT
WATER STERILE IRR 3000ML UROMA (IV SOLUTION) ×4 IMPLANT

## 2016-11-28 NOTE — Anesthesia Preprocedure Evaluation (Signed)
Anesthesia Evaluation  Patient identified by MRN, date of birth, ID band Patient awake    Reviewed: Allergy & Precautions, NPO status , Patient's Chart, lab work & pertinent test results  History of Anesthesia Complications (+) PONV  Airway Mallampati: II  TM Distance: >3 FB Neck ROM: Full    Dental  (+) Teeth Intact   Pulmonary Recent URI , Residual Cough, Current Smoker,    breath sounds clear to auscultation       Cardiovascular Exercise Tolerance: Good hypertension,  Rhythm:Regular  HTN by history   Neuro/Psych Anxiety    GI/Hepatic Occasional heartburn   Endo/Other    Renal/GU      Musculoskeletal   Abdominal   Peds  Hematology   Anesthesia Other Findings   Reproductive/Obstetrics                             Anesthesia Physical Anesthesia Plan  ASA: II and emergent  Anesthesia Plan: General   Post-op Pain Management:    Induction: Intravenous, Rapid sequence and Cricoid pressure planned  Airway Management Planned: Oral ETT  Additional Equipment:   Intra-op Plan:   Post-operative Plan: Extubation in OR  Informed Consent: I have reviewed the patients History and Physical, chart, labs and discussed the procedure including the risks, benefits and alternatives for the proposed anesthesia with the patient or authorized representative who has indicated his/her understanding and acceptance.   Dental advisory given  Plan Discussed with: Surgeon  Anesthesia Plan Comments:         Anesthesia Quick Evaluation

## 2016-11-28 NOTE — Discharge Instructions (Signed)

## 2016-11-28 NOTE — Op Note (Signed)
Preoperative diagnosis: Obstructing left distal ureteral stone with urinary tract infection.  Postoperative diagnosis: Same  Principal procedure: Cystoscopy, left retrograde ureteropyelogram with fluoroscopic interpretation, placement of left double-J stent.--24 centimeter by 6 [place]French contour without tether  Surgeon: Atarah Cadogan  Anesthesia: Gen. with LMA.  Specimen: Urine for culture.  Drains: 24 centimeters by 6 French contour double-J stent  Complications: None.  Indications: 31 year old female with symptomatic left distal ureteral stone.  Over the past 2-3 days.  She presented to the emergency room where this was diagnosed with CT scan.  The patient also had a probable UTI.  Although she has not had significant fevers, she has had some chills and lower urinary tract symptomatology.  Because of the presence of infected appearing urine, as well as her ureteral calculus, it was recommended that she undergo cystoscopy and double-J stent placement.  This was discussed with the patient and her husband.  They understand this procedure, as well as urgent need for drainage of her kidney.  They desire to proceed.  Findings: The bladder appeared normal.  Ureteral orifices were normal.  No urothelial lesions were noted.  Retrograde ureteropyelogram revealed a filling defect in the left distal ureter consistent with her stone.  This proximal hydroureteronephrosis.  No other filling defects were seen.  There was pyelocaliectasis.  Description of procedure: The patient was properly identified, and the surgical side marked.  She was taken the operating room where general anesthetic was administered with the LMA.  She was placed in the dorsolithotomy position.  Genitalia and perineum were prepped and draped.  Proper timeout was performed.  A 21 French panendoscope was advanced in her bladder which was inspected and the findings noted above.  Left retrograde ureteropyelogram was then performed using 6 [place]  open-ended catheter and Omnipaque.  This revealed a filling defect in the distal ureter with the above-noted findings.  A sip wire was then advanced through the open-ended catheter, and easily passed the stone.  An was seen curled in the upper pole calyceal system.  I then removed the open-ended catheter, after replacing the sip wire with a sensor-tip guidewire.  I then placed the 24 centimeter by 6 French contour double-J stent.  The tether had been removed.  Proper positioning was seen using fluoroscopy and cystoscopy.  Following deployment by removing the sensor-tip guidewire.  I observe the urine coming out of the stent, which was bloody, but did not seem significantly purulent.  This point, the bladder was drained.  The scope was removed.  The patient was then awakened and taken to the PACU in stable condition.  She tolerated the procedure well.

## 2016-11-28 NOTE — Anesthesia Procedure Notes (Signed)
Procedure Name: Intubation Date/Time: 11/28/2016 6:17 AM Performed by: Franco NonesYATES, Dorreen Valiente S Pre-anesthesia Checklist: Patient identified, Patient being monitored, Timeout performed, Emergency Drugs available and Suction available Patient Re-evaluated:Patient Re-evaluated prior to inductionOxygen Delivery Method: Circle System Utilized Preoxygenation: Pre-oxygenation with 100% oxygen Intubation Type: IV induction, Rapid sequence and Cricoid Pressure applied Laryngoscope Size: Miller and 2 Grade View: Grade I Tube type: Oral Tube size: 7.0 mm Number of attempts: 1 Airway Equipment and Method: Stylet and Oral airway Placement Confirmation: ETT inserted through vocal cords under direct vision,  positive ETCO2 and breath sounds checked- equal and bilateral Secured at: 21 cm Tube secured with: Tape Dental Injury: Teeth and Oropharynx as per pre-operative assessment

## 2016-11-28 NOTE — ED Provider Notes (Signed)
[facility]-EM[facility]Y DEPT Provider Note   CSN: [id number] Arrival date & time: 11/28/16  0043  By signing my name below, I, Elder NegusRussell Johnston, attest that this documentation has been prepared under the direction and in the presence of Gilda Creasehristopher J Bellagrace Sylvan, MD. Electronically Signed: Elder Negusussell Johnston, Scribe. 11/28/16. 1:02 AM.   History   Chief Complaint Chief Complaint  Patient presents with  . Flank Pain    HPI Saverio DankerSara E Ruben is a 31 y.o. female with history of hypertension who presents to the ED for evaluation of abdominal pain. This patient states that in the last 24 hours she has experienced bilateral lower abdominal pain which extends toward her L flank. She has also experienced urinary frequency over this time period. No dysuria. Also experiencing nausea and vomiting. No diarrhea. No objective fever; is reporting hot/cold flashes.  The history is provided by the patient. No language interpreter was used.  Flank Pain  This is a new problem. The current episode started 12 to 24 hours ago. The problem occurs constantly. The problem has not changed since onset.Associated symptoms include abdominal pain.    Past Medical History:  Diagnosis Date  . Allergy   . Anxiety   . Depression   . Hypertension   . Insomnia     There are no active problems to display for this patient.   Past Surgical History:  Procedure Laterality Date  . BREAST SURGERY     augmentation  . EYE SURGERY     lasix  . WISDOM TOOTH EXTRACTION      OB History    No data available       Home Medications    Prior to Admission medications   Medication Sig Start Date End Date Taking? Authorizing Provider  amphetamine-dextroamphetamine (ADDERALL XR) 20 MG 24 hr capsule Take 20 mg by mouth daily.    Historical Provider, MD  diclofenac (VOLTAREN) 75 MG EC tablet Take 1 tablet (75 mg total) by mouth 2 (two) times daily. 10/08/12   Heather Jaquita RectorM Marte, PA-C  Melatonin 5 MG TABS Take by mouth. 1-2 qhs prn     Historical Provider, MD  Norgestim-Eth Estrad Triphasic (ORTHO TRI-CYCLEN, 28, PO) Take 1 tablet by mouth daily.    Historical Provider, MD  Vilazodone HCl (VIIBRYD) 40 MG TABS Take 40 mg by mouth daily.    Historical Provider, MD  zolpidem (AMBIEN CR) 12.5 MG CR tablet Take 12.5 mg by mouth at bedtime as needed for sleep.    Historical Provider, MD    Family History Family History  Problem Relation Age of Onset  . Hypertension Mother   . Depression Mother   . Hyperlipidemia Father   . Hypertension Father   . ADD / ADHD Sister   . Depression Sister   . ADD / ADHD Brother   . Hypertension Brother   . Stroke Maternal Grandmother   . Hypertension Maternal Grandmother   . Kidney failure Paternal Grandmother     Social History Social History  Substance Use Topics  . Smoking status: Current Every Day Smoker    Packs/day: 0.50    Years: 3.00  . Smokeless tobacco: Never Used  . Alcohol use No     Allergies   Amoxicillin; Penicillins; and Sulfa antibiotics   Review of Systems Review of Systems  Gastrointestinal: Positive for abdominal pain, nausea and vomiting. Negative for diarrhea.  Genitourinary: Positive for flank pain and frequency. Negative for dysuria.  All other systems reviewed and are negative.  Physical Exam Updated Vital Signs BP (!) 142/91 (BP Location: Right Arm)   Pulse 71   Temp 97.8 F (36.6 C) (Oral)   Resp 16   Ht 5\' 3"  (1.6 m)   Wt 135 lb (61.2 kg)   LMP 11/11/2016 (Exact Date)   SpO2 98%   BMI 23.91 kg/m   Physical Exam  Constitutional: She is oriented to person, place, and time. She appears well-developed and well-nourished. No distress.  HENT:  Head: Normocephalic and atraumatic.  Right Ear: Hearing normal.  Left Ear: Hearing normal.  Nose: Nose normal.  Mouth/Throat: Oropharynx is clear and moist and mucous membranes are normal.  Eyes: Conjunctivae and EOM are normal. Pupils are equal, round, and reactive to light.  Neck: Normal  range of motion. Neck supple.  Cardiovascular: Regular rhythm, S1 normal and S2 normal.  Exam reveals no gallop and no friction rub.   No murmur heard. Pulmonary/Chest: Effort normal and breath sounds normal. No respiratory distress. She exhibits no tenderness.  Abdominal: Soft. Normal appearance and bowel sounds are normal. There is no hepatosplenomegaly. There is no rebound, no guarding, no tenderness at McBurney's point and negative Murphy's sign. No hernia.  Mildly diffuse Left lower abdominal tenderness.   Musculoskeletal: Normal range of motion.  No CVA tenderness.  Neurological: She is alert and oriented to person, place, and time. She has normal strength. No cranial nerve deficit or sensory deficit. Coordination normal. GCS eye subscore is 4. GCS verbal subscore is 5. GCS motor subscore is 6.  Skin: Skin is warm, dry and intact. No rash noted. No cyanosis.  Psychiatric: She has a normal mood and affect. Her speech is normal and behavior is normal. Thought content normal.  Nursing note and vitals reviewed.    ED Treatments / Results  Labs (all labs ordered are listed, but only abnormal results are displayed) Labs Reviewed  CBC WITH DIFFERENTIAL/PLATELET - Abnormal; Notable for the following:       Result Value   WBC 13.2 (*)    Neutro Abs 10.6 (*)    Monocytes Absolute 1.3 (*)    All other components within normal limits  BASIC METABOLIC PANEL - Abnormal; Notable for the following:    Glucose, Bld 126 (*)    All other components within normal limits  URINALYSIS, ROUTINE W REFLEX MICROSCOPIC - Abnormal; Notable for the following:    Color, Urine AMBER (*)    APPearance HAZY (*)    Specific Gravity, Urine 1.033 (*)    Ketones, ur 20 (*)    Protein, ur 100 (*)    Nitrite POSITIVE (*)    Squamous Epithelial / LPF 6-30 (*)    All other components within normal limits  URINE CULTURE  POC URINE PREG, ED    EKG  EKG Interpretation None       Radiology Ct Renal Stone  Study  Result Date: 11/28/2016 CLINICAL DATA:  History of hypertension, lower abdominal pain EXAM: CT ABDOMEN AND PELVIS WITHOUT CONTRAST TECHNIQUE: Multidetector CT imaging of the abdomen and pelvis was performed following the standard protocol without IV contrast. COMPARISON:  11/03/2010 FINDINGS: Lower chest: No acute abnormality. Partially visualized breast prostheses Hepatobiliary: No focal liver abnormality is seen. No gallstones, gallbladder wall thickening, or biliary dilatation. Pancreas: Unremarkable. No pancreatic ductal dilatation or surrounding inflammatory changes. Spleen: Normal in size without focal abnormality. Adrenals/Urinary Tract: Adrenal glands are within normal limits. Slight increased density of the right renal pyramids. Left perinephric fat stranding. Moderate left hydronephrosis and  hydroureter, secondary to a 4 mm stone in the distal left ureter, about a cm short of the left UVJ. Bladder normal Stomach/Bowel: Stomach is within normal limits. Appendix appears normal. No evidence of bowel wall thickening, distention, or inflammatory changes. Vascular/Lymphatic: No significant vascular findings are present. No enlarged abdominal or pelvic lymph nodes. Reproductive: Uterus and bilateral adnexa are unremarkable. Other: No abdominal wall hernia or abnormality. No abdominopelvic ascites. Musculoskeletal: No acute or significant osseous findings. IMPRESSION: Moderate left hydronephrosis and hydroureter secondary to a 4 mm stone in the distal left ureter just above the left UVJ. There are no other acute abnormalities visualized. Electronically Signed   By: Jasmine Pang M.D.   On: 11/28/2016 03:14    Procedures Procedures (including critical care time)  Angiocath insertion Performed by: Genevie Elman J.  Consent: Verbal consent obtained. Risks and benefits: risks, benefits and alternatives were discussed Time out: Immediately prior to procedure a "time out" was called to verify  the correct patient, procedure, equipment, support staff and site/side marked as required.  Preparation: Patient was prepped and draped in the usual sterile fashion.  Vein Location: left antecub  Ultrasound Guided  Gauge: 20  Normal blood return and flush without difficulty Patient tolerance: Patient tolerated the procedure well with no immediate complications.     Medications Ordered in ED Medications  cefTRIAXone (ROCEPHIN) injection 1 g (1 g Intramuscular Given 11/28/16 0210)  HYDROmorphone (DILAUDID) injection 1 mg (1 mg Intramuscular Given 11/28/16 0210)  ondansetron (ZOFRAN-ODT) disintegrating tablet 8 mg (8 mg Oral Given 11/28/16 0210)  lidocaine (PF) (XYLOCAINE) 1 % injection (  Given 11/28/16 0304)  metoCLOPramide (REGLAN) injection 10 mg (10 mg Intramuscular Given 11/28/16 0321)  diphenhydrAMINE (BENADRYL) injection 25 mg (25 mg Intramuscular Given 11/28/16 0321)  HYDROmorphone (DILAUDID) injection 1 mg (1 mg Intravenous Given 11/28/16 0405)     Initial Impression / Assessment and Plan / ED Course  I have reviewed the triage vital signs and the nursing notes.  Pertinent labs & imaging results that were available during my care of the patient were reviewed by me and considered in my medical decision making (see chart for details).     Patient presents to the emergency department for evaluation of low back pain, urinary frequency, urinary dysuria and now left flank pain. Symptoms were initially urinary frequency with low urine volume and some dysuria with diffuse low back pain. Tonight, however, she had onset of severe left flank pain has been more persistent. She does report a previous kidney stone approximately 8 years ago. Urinalysis was suspicious for infection. She did, however, have too numerous to count red cells and therefore CT scan was performed to evaluate for possible stone. CT does confirm distal left ureteral stone with moderate hydronephrosis.  There was difficulty  achieving IV access initially. First dose of analgesia was given intramuscularly. She was also given Rocephin 1 g intramuscularly. After kidney stone was confirmed, I placed an IV under ultrasound guidance.  Patient was discussed with Dr. Retta Diones, on call for urology. He will see the patient in the ER to determine if she needs decompression stenting.  Final Clinical Impressions(s) / ED Diagnoses   Final diagnoses:  Ureteral colic    New Prescriptions New Prescriptions   No medications on file  I personally performed the services described in this documentation, which was scribed in my presence. The recorded information has been reviewed and is accurate.    Gilda Crease, MD 11/28/16 [phone]

## 2016-11-28 NOTE — Transfer of Care (Signed)
Immediate Anesthesia Transfer of Care Note  Patient: [name]  Procedure(s) Performed: Procedure(s): CYSTOSCOPY WITH LEFT RETROGRADE PYELOGRAM/LEFT URETERAL STENT PLACEMENT  Patient Location: PACU  Anesthesia Type:General  Level of Consciousness: awake and alert   Airway & Oxygen Therapy: Patient Spontanous Breathing  Post-op Assessment: Report given to RN and Post -op Vital signs reviewed and stable  Post vital signs: Reviewed and stable  Last Vitals:  Vitals:   11/28/16 0359 11/28/16 0543  BP: (!) 142/91 (!) 142/85  Pulse: 71   Resp: 16 16  Temp:      Last Pain:  Vitals:   11/28/16 0502  TempSrc:   PainSc: 5          Complications: No apparent anesthesia complications

## 2016-11-28 NOTE — Progress Notes (Signed)
  Nov 28, 2016  Patient: [name]  Date of Birth: 04/20/1986  Date of Visit: 11/28/2016    To Whom It May Concern:  [name] was seen and treated in our emergency department and [facility] on 11/28/2016. [name]  may return to work on 11/30/2016.  Sincerely,   Garvin Ellena Jacqlyn LarsenAnne Elaisha Zahniser, RN

## 2016-11-28 NOTE — ED Triage Notes (Signed)
Patient states that she is having urinary frequency, urgency, pain with urination, left flank pain.  Started yesterday.  Also has nausea and vomiting.

## 2016-11-28 NOTE — H&P (Signed)
H&P  Chief Complaint: Left flank pain, frequency, urgency, dysuria  History of Present Illness: [name] is a 31 y.o. year old female with a prior history of urolithiasis, presenting to the emergency room a few hours ago with a 2 to three-day history of left flank pain, lower quadrant pain and bladder pressure, frequency and urgency as well as dysuria.  She does not have frequent urinary tract infections.  She did have a kidney stone passed a few years ago.  Evaluation in the emergency room included CT stone protocol, which revealed a 4 millimeter left distal ureteral stone with moderate left hydroureteronephrosis.  Urinalysis was nitrite positive, showed multiple white cells but no bacteria.  She did have slight elevation of her white blood cell count.  Urologic consultation is requested.  Past Medical History:  Diagnosis Date  . Allergy   . Anxiety   . Depression   . Hypertension   . Insomnia     Past Surgical History:  Procedure Laterality Date  . BREAST SURGERY     augmentation  . EYE SURGERY     lasix  . WISDOM TOOTH EXTRACTION      Home Medications:   (Not in a hospital admission)  Allergies:  Allergies  Allergen Reactions  . Amoxicillin   . Penicillins Rash  . Sulfa Antibiotics Rash    Family History  Problem Relation Age of Onset  . Hypertension Mother   . Depression Mother   . Hyperlipidemia Father   . Hypertension Father   . ADD / ADHD Sister   . Depression Sister   . ADD / ADHD Brother   . Hypertension Brother   . Stroke Maternal Grandmother   . Hypertension Maternal Grandmother   . Kidney failure Paternal Grandmother     Social History:  reports that she has been smoking.  She has a 1.50 pack-year smoking history. She has never used smokeless tobacco. She reports that she does not drink alcohol or use drugs.  ROS: A complete review of systems was performed.  All systems are negative except for pertinent findings as noted.  Physical Exam:   Vital signs in last 24 hours: Temp:  [97.8 F (36.6 C)] 97.8 F (36.6 C) (05/03 0054) Pulse Rate:  [71-82] 71 (05/03 0359) Resp:  [16-17] 16 (05/03 0359) BP: (142-168)/(91-109) 142/91 (05/03 0359) SpO2:  [98 %-100 %] 98 % (05/03 0359) Weight:  [61.2 kg (135 lb)] 61.2 kg (135 lb) (05/03 0055) General:  Alert and oriented, No acute distress HEENT: Normocephalic, atraumatic Neck: No JVD or lymphadenopathy Cardiovascular: Regular rate and rhythm Lungs: Clear bilaterally Abdomen: Soft, Left lower quadrant and left CVA tenderness. Extremities: No edema Neurologic: Grossly intact  Laboratory Data:  Results for orders placed or performed during the hospital encounter of 11/28/16 (from the past 24 hour(s))  Urinalysis, Routine w reflex microscopic     Status: Abnormal   Collection Time: 11/28/16 12:50 AM  Result Value Ref Range   Color, Urine AMBER (A) YELLOW   APPearance HAZY (A) CLEAR   Specific Gravity, Urine 1.033 (H) 1.005 - 1.030   pH 5.0 5.0 - 8.0   Glucose, UA NEGATIVE NEGATIVE mg/dL   Hgb urine dipstick NEGATIVE NEGATIVE   Bilirubin Urine NEGATIVE NEGATIVE   Ketones, ur 20 (A) NEGATIVE mg/dL   Protein, ur 161100 (A) NEGATIVE mg/dL   Nitrite POSITIVE (A) NEGATIVE   Leukocytes, UA NEGATIVE NEGATIVE   RBC / HPF TOO NUMEROUS TO COUNT 0 - 5 RBC/hpf  WBC, UA TOO NUMEROUS TO COUNT 0 - 5 WBC/hpf   Bacteria, UA NONE SEEN NONE SEEN   Squamous Epithelial / LPF 6-30 (A) NONE SEEN   Mucous PRESENT    Ca Oxalate Crys, UA PRESENT   POC urine preg, ED (not at [facility])     Status: None   Collection Time: 11/28/16  1:21 AM  Result Value Ref Range   Preg Test, Ur NEGATIVE NEGATIVE  CBC with Differential/Platelet     Status: Abnormal   Collection Time: 11/28/16  1:37 AM  Result Value Ref Range   WBC 13.2 (H) 4.0 - 10.5 K/uL   RBC 4.55 3.87 - 5.11 MIL/uL   Hemoglobin 14.2 12.0 - 15.0 g/dL   HCT 16.1 09.6 - 04.5 %   MCV 88.6 78.0 - 100.0 fL   MCH 31.2 26.0 - 34.0 pg   MCHC 35.2 30.0 - 36.0  g/dL   RDW 40.9 81.1 - 91.4 %   Platelets 348 150 - 400 K/uL   Neutrophils Relative % 80 %   Neutro Abs 10.6 (H) 1.7 - 7.7 K/uL   Lymphocytes Relative 9 %   Lymphs Abs 1.2 0.7 - 4.0 K/uL   Monocytes Relative 10 %   Monocytes Absolute 1.3 (H) 0.1 - 1.0 K/uL   Eosinophils Relative 1 %   Eosinophils Absolute 0.1 0.0 - 0.7 K/uL   Basophils Relative 0 %   Basophils Absolute 0.1 0.0 - 0.1 K/uL  Basic metabolic panel     Status: Abnormal   Collection Time: 11/28/16  1:37 AM  Result Value Ref Range   Sodium 139 135 - 145 mmol/L   Potassium 3.9 3.5 - 5.1 mmol/L   Chloride 103 101 - 111 mmol/L   CO2 27 22 - 32 mmol/L   Glucose, Bld 126 (H) 65 - 99 mg/dL   BUN 15 6 - 20 mg/dL   Creatinine, Ser 7.82 0.44 - 1.00 mg/dL   Calcium 9.2 8.9 - 95.6 mg/dL   GFR calc non Af Amer >60 >60 mL/min   GFR calc Af Amer >60 >60 mL/min   Anion gap 9 5 - 15   No results found for this or any previous visit (from the past 240 hour(s)). Creatinine:  Recent Labs  11/28/16 0137  CREATININE 0.86    Radiologic Imaging: Ct Renal Stone Study  Result Date: 11/28/2016 CLINICAL DATA:  History of hypertension, lower abdominal pain EXAM: CT ABDOMEN AND PELVIS WITHOUT CONTRAST TECHNIQUE: Multidetector CT imaging of the abdomen and pelvis was performed following the standard protocol without IV contrast. COMPARISON:  11/03/2010 FINDINGS: Lower chest: No acute abnormality. Partially visualized breast prostheses Hepatobiliary: No focal liver abnormality is seen. No gallstones, gallbladder wall thickening, or biliary dilatation. Pancreas: Unremarkable. No pancreatic ductal dilatation or surrounding inflammatory changes. Spleen: Normal in size without focal abnormality. Adrenals/Urinary Tract: Adrenal glands are within normal limits. Slight increased density of the right renal pyramids. Left perinephric fat stranding. Moderate left hydronephrosis and hydroureter, secondary to a 4 mm stone in the distal left ureter, about a cm  short of the left UVJ. Bladder normal Stomach/Bowel: Stomach is within normal limits. Appendix appears normal. No evidence of bowel wall thickening, distention, or inflammatory changes. Vascular/Lymphatic: No significant vascular findings are present. No enlarged abdominal or pelvic lymph nodes. Reproductive: Uterus and bilateral adnexa are unremarkable. Other: No abdominal wall hernia or abnormality. No abdominopelvic ascites. Musculoskeletal: No acute or significant osseous findings. IMPRESSION: Moderate left hydronephrosis and hydroureter secondary to a 4 mm  stone in the distal left ureter just above the left UVJ. There are no other acute abnormalities visualized. Electronically Signed   By: Jasmine Pang M.D.   On: 11/28/2016 03:14    Impression/Assessment:  Left distal ureteral stone, patient fairly comfortable, but there is evidence of urinary tract infection.  Plan:  I discussed the presence of possible UTI as well as her left distal ureteral stone with the patient.  Because the presence of nitrite in her urine, and the appearance of an infection, I feel best to proceed with cystoscopy and stent placement.  The patient agrees with this procedure.  If she does well, and there is no significant purulent urine with stent placement, I would feel comfortable letting her go home on antibiotics.  If there is significant purulent urine, I will admit her at least for overnight observation.    Chelsea Aus 11/28/2016, 5:17 AM  Bertram Millard. Shariece Viveiros MD

## 2016-11-28 NOTE — Anesthesia Postprocedure Evaluation (Signed)
Anesthesia Post Note  Patient: [name]  Procedure(s) Performed: Procedure(s): CYSTOSCOPY WITH LEFT RETROGRADE PYELOGRAM/LEFT URETERAL STENT PLACEMENT  Patient location during evaluation: PACU Anesthesia Type: General Level of consciousness: awake and alert Pain management: satisfactory to patient Vital Signs Assessment: post-procedure vital signs reviewed and stable Respiratory status: spontaneous breathing Cardiovascular status: stable Anesthetic complications: no     Last Vitals:  Vitals:   11/28/16 0700 11/28/16 0706  BP: (!) 145/96   Pulse: 91 88  Resp: (!) 25 19  Temp:      Last Pain:  Vitals:   11/28/16 0706  TempSrc:   PainSc: 5                  Clara Herbison

## 2016-11-29 ENCOUNTER — Encounter (HOSPITAL_COMMUNITY): Payer: Self-pay | Admitting: Urology

## 2016-11-29 LAB — URINE CULTURE
Culture: NO GROWTH
Culture: NO GROWTH

## 2016-12-03 ENCOUNTER — Encounter (HOSPITAL_COMMUNITY): Payer: Self-pay | Admitting: Urology

## 2016-12-04 ENCOUNTER — Encounter (HOSPITAL_COMMUNITY): Payer: Self-pay | Admitting: Urology

## 2016-12-17 ENCOUNTER — Ambulatory Visit: Payer: Self-pay | Admitting: Urology

## 2017-02-07 ENCOUNTER — Encounter (HOSPITAL_BASED_OUTPATIENT_CLINIC_OR_DEPARTMENT_OTHER): Payer: Self-pay | Admitting: *Deleted

## 2017-02-07 ENCOUNTER — Other Ambulatory Visit: Payer: Self-pay | Admitting: Urology

## 2017-02-07 NOTE — Progress Notes (Signed)
[facility],urine pregnancy on arrival-Npo after Mn-may take pain med. As need with small amt water.

## 2017-02-18 ENCOUNTER — Encounter (HOSPITAL_BASED_OUTPATIENT_CLINIC_OR_DEPARTMENT_OTHER)
Admission: RE | Disposition: A | Discharge status: Home / Self Care | Payer: Self-pay | Source: Ambulatory Visit | Attending: Urology

## 2017-02-18 ENCOUNTER — Ambulatory Visit (HOSPITAL_BASED_OUTPATIENT_CLINIC_OR_DEPARTMENT_OTHER): Payer: Medicaid Other | Admitting: Anesthesiology

## 2017-02-18 ENCOUNTER — Ambulatory Visit (HOSPITAL_BASED_OUTPATIENT_CLINIC_OR_DEPARTMENT_OTHER)
Admission: RE | Admit: 2017-02-18 | Discharge: 2017-02-18 | Disposition: A | Discharge status: Home / Self Care | Payer: Medicaid Other | Source: Ambulatory Visit | Attending: Urology | Admitting: Urology

## 2017-02-18 ENCOUNTER — Encounter (HOSPITAL_BASED_OUTPATIENT_CLINIC_OR_DEPARTMENT_OTHER): Payer: Self-pay | Admitting: *Deleted

## 2017-02-18 DIAGNOSIS — N390 Urinary tract infection, site not specified: Secondary | ICD-10-CM | POA: Diagnosis not present

## 2017-02-18 DIAGNOSIS — Z539 Procedure and treatment not carried out, unspecified reason: Secondary | ICD-10-CM | POA: Insufficient documentation

## 2017-02-18 DIAGNOSIS — F17210 Nicotine dependence, cigarettes, uncomplicated: Secondary | ICD-10-CM | POA: Insufficient documentation

## 2017-02-18 DIAGNOSIS — Z87442 Personal history of urinary calculi: Secondary | ICD-10-CM | POA: Insufficient documentation

## 2017-02-18 DIAGNOSIS — N200 Calculus of kidney: Secondary | ICD-10-CM | POA: Diagnosis present

## 2017-02-18 DIAGNOSIS — R310 Gross hematuria: Secondary | ICD-10-CM | POA: Diagnosis not present

## 2017-02-18 DIAGNOSIS — Z880 Allergy status to penicillin: Secondary | ICD-10-CM | POA: Insufficient documentation

## 2017-02-18 HISTORY — DX: Personal history of urinary calculi: Z87.442

## 2017-02-18 LAB — POCT PREGNANCY, URINE: PREG TEST UR: POSITIVE — AB

## 2017-02-18 LAB — HCG, SERUM, QUALITATIVE: PREG SERUM: POSITIVE — AB

## 2017-02-18 LAB — POCT HEMOGLOBIN-HEMACUE: HEMOGLOBIN: 14.3 g/dL (ref 12.0–15.0)

## 2017-02-18 SURGERY — CYSTOURETEROSCOPY, WITH RETROGRADE PYELOGRAM AND STENT INSERTION
Anesthesia: General | Laterality: Left

## 2017-02-18 MED ORDER — MIDAZOLAM HCL 2 MG/2ML IJ SOLN
INTRAMUSCULAR | Status: AC
  Filled 2017-02-18: qty 2

## 2017-02-18 MED ORDER — OXYCODONE-ACETAMINOPHEN 5-325 MG PO TABS: 1.0000 | ORAL_TABLET | Freq: Four times a day (QID) | ORAL | 0 refills | Status: DC | PRN

## 2017-02-18 MED ORDER — PROPOFOL 10 MG/ML IV BOLUS
INTRAVENOUS | Status: AC
  Filled 2017-02-18: qty 20

## 2017-02-18 MED ORDER — FENTANYL CITRATE (PF) 100 MCG/2ML IJ SOLN
INTRAMUSCULAR | Status: AC
  Filled 2017-02-18: qty 2

## 2017-02-18 MED ORDER — CIPROFLOXACIN IN D5W 400 MG/200ML IV SOLN
INTRAVENOUS | Status: AC
  Filled 2017-02-18: qty 200

## 2017-02-18 MED ORDER — KETOROLAC TROMETHAMINE 30 MG/ML IJ SOLN
INTRAMUSCULAR | Status: AC
  Filled 2017-02-18: qty 1

## 2017-02-18 MED ORDER — LIDOCAINE 2% (20 MG/ML) 5 ML SYRINGE
INTRAMUSCULAR | Status: AC
  Filled 2017-02-18: qty 5

## 2017-02-18 MED ORDER — ONDANSETRON HCL 4 MG/2ML IJ SOLN
INTRAMUSCULAR | Status: AC
  Filled 2017-02-18: qty 2

## 2017-02-18 MED ORDER — DEXAMETHASONE SODIUM PHOSPHATE 10 MG/ML IJ SOLN
INTRAMUSCULAR | Status: AC
  Filled 2017-02-18: qty 1

## 2017-02-18 MED ORDER — CIPROFLOXACIN IN D5W 400 MG/200ML IV SOLN
400.0000 mg | Freq: Two times a day (BID) | INTRAVENOUS | Status: DC
  Filled 2017-02-18: qty 200

## 2017-02-18 MED ORDER — LACTATED RINGERS IV SOLN
INTRAVENOUS | Status: DC
  Administered 2017-02-18: 08:00:00 via INTRAVENOUS
  Filled 2017-02-18 (×2): qty 1000

## 2017-02-18 SURGICAL SUPPLY — 25 items
BAG DRAIN URO-CYSTO SKYTR STRL (DRAIN) ×3 IMPLANT
BASKET STONE 1.7 NGAGE (UROLOGICAL SUPPLIES) IMPLANT
BASKET ZERO TIP NITINOL 2.4FR (BASKET) IMPLANT
CATH URET 5FR 28IN CONE TIP (BALLOONS)
CATH URET 5FR 28IN OPEN ENDED (CATHETERS) IMPLANT
CATH URET 5FR 70CM CONE TIP (BALLOONS) IMPLANT
CLOTH BEACON ORANGE TIMEOUT ST (SAFETY) ×3 IMPLANT
ELECT REM PT RETURN 9FT ADLT (ELECTROSURGICAL)
ELECTRODE REM PT RTRN 9FT ADLT (ELECTROSURGICAL) IMPLANT
FIBER LASER FLEXIVA 365 (UROLOGICAL SUPPLIES) IMPLANT
FIBER LASER TRAC TIP (UROLOGICAL SUPPLIES) IMPLANT
GLOVE SURG SS PI 8.0 STRL IVOR (GLOVE) ×3 IMPLANT
GOWN STRL REUS W/TWL LRG LVL3 (GOWN DISPOSABLE) IMPLANT
GOWN STRL REUS W/TWL XL LVL3 (GOWN DISPOSABLE) ×3 IMPLANT
GUIDEWIRE 0.038 PTFE COATED (WIRE) IMPLANT
GUIDEWIRE ANG ZIPWIRE 038X150 (WIRE) IMPLANT
GUIDEWIRE STR DUAL SENSOR (WIRE) ×3 IMPLANT
INFUSOR MANOMETER BAG 3000ML (MISCELLANEOUS) ×3 IMPLANT
IV NS IRRIG 3000ML ARTHROMATIC (IV SOLUTION) ×3 IMPLANT
KIT RM TURNOVER CYSTO AR (KITS) ×3 IMPLANT
MANIFOLD NEPTUNE II (INSTRUMENTS) ×3 IMPLANT
PACK CYSTO (CUSTOM PROCEDURE TRAY) ×3 IMPLANT
SHEATH ACCESS URETERAL 38CM (SHEATH) IMPLANT
TUBE CONNECTING 12'X1/4 (SUCTIONS)
TUBE CONNECTING 12X1/4 (SUCTIONS) IMPLANT

## 2017-02-18 NOTE — Interval H&P Note (Deleted)
History and Physical Interval Note:  02/18/2017 9:26 AM  [name]  has presented today for surgery, with the diagnosis of nephrolithiasis  The various methods of treatment have been discussed with the patient and family. After consideration of risks, benefits and other options for treatment, the patient has consented to  Procedure(s): CYSTOSCOPY WITH LEFT  RETROGRADE PYELOGRAM, LEFT  URETEROSCOPY STONE EXTRACTION AND STENT EXCHANGE (Left) HOLMIUM LASER APPLICATION (Left) as a surgical intervention .  The patient's history has been reviewed, patient examined, no change in status, stable for surgery.  I have reviewed the patient's chart and labs.  Questions were answered to the patient's satisfaction.    Preoperative pregnancy test positive, confirmed with serum. Pregnancy within 4-8 weeks, first trimester based on LMP and previous pregnancy test. Discussed risks and benefits of moving forward with surgery. Given stone burden and stent bother will move forward, will use as minimal fluoroscopy as possible. Patient agrees.    [name]

## 2017-02-18 NOTE — H&P (Signed)
CC: I have kidney stones.  HPI: [name] is a 31 year-old female established patient who is here for renal calculi.    Patient presented to the ED on 5/3 with a 4mm left distal ureteral stone causing hydronephrosis. There was concern for a UTI, and patient underwent 6Fr x 24cm JJ stent placement. Urine cultures resulted in no growth. She presents today for further management discussion.   Patient has been significantly bothered by stent, with feeling of flank pain, urgency, frequency, gross hematuria. She has been taking oxybutinin 5mg  TID, tramadol, and ibuprofen with some relief. She denies fevers chills. Went to urgent care recently and was told she had a UTI, and was started on ciprofloxacin which she is currently [place].   2nd kidney stone episode, first passed spontaneously about 5 years ago.     ALLERGIES: Amoxicillin TABS Penicillins Sulfa Drugs    MEDICATIONS: Cipro 250 mg tablet  Oxybutynin Chloride  Ambien  Azo-Cranberry CAPS Oral  Ibuprofen CAPS Oral  Multi-Vitamin Oral Tablet Oral  Tramadol Hcl 50 mg tablet 1 tablet PO every 4-6 hours PRN pain  Zolpidem Tartrate 10 MG Oral Tablet Oral     GU PSH: None     PSH Notes: Corneal LASIK Bilateral, Breast Surgery, Oral Surgery Tooth Extraction   NON-GU PSH: Breast Surgery Procedure - 2010 Dental Surgery Procedure - 2010    GU PMH: RLQ pain, Abdominal pain, RLQ (right lower quadrant) - 2014 Ureteral calculus, Calculus of left ureter - 2014      PMH Notes:  1898-07-29 00:00:00 - Note: Normal Routine History And Physical Adult   NON-GU PMH: Anxiety, Anxiety - 2014 Nausea, Nausea - 2014 Personal history of other diseases of the circulatory system, History of hypertension - 2014 Depression Hypertension    FAMILY HISTORY: Hematuria - Grandmother nephrolithiasis - Grandmother renal failure - Grandmother   SOCIAL HISTORY: Marital Status: Single Current Smoking Status: Patient smokes. Smokes 1/2 pack per day.    Tobacco Use Assessment Completed: Used Tobacco in last 30 days? Does not drink anymore.  Drinks 1 caffeinated drink per day. 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     Notes: Marital History - Single, Caffeine Use, Alcohol Use, Tobacco Use   REVIEW OF SYSTEMS:    GU Review Female:   Patient reports frequent urination, hard to postpone urination, burning /pain with urination, get up at night to urinate, leakage of urine, stream starts and stops, trouble starting your stream, and have to strain to urinate. Patient denies being pregnant.  Gastrointestinal (Upper):   Patient denies nausea, vomiting, and indigestion/ heartburn.  Gastrointestinal (Lower):   Patient denies diarrhea and constipation.  Constitutional:   Patient denies fever, night sweats, weight loss, and fatigue.  Skin:   Patient denies skin rash/ lesion and itching.  Eyes:   Patient denies blurred vision and double vision.  Ears/ Nose/ Throat:   Patient denies sore throat and sinus problems.  Hematologic/Lymphatic:   Patient denies swollen glands and easy bruising.  Cardiovascular:   Patient denies chest pains and leg swelling.  Respiratory:   Patient denies cough and shortness of breath.  Endocrine:   Patient denies excessive thirst.  Musculoskeletal:   Patient reports back pain. Patient denies joint pain.  Neurological:   Patient denies headaches and dizziness.  Psychologic:   Patient reports anxiety. Patient denies depression.   VITAL SIGNS:      02/06/2017 04:12 PM  Weight 135 lb / 61.23 kg  Height 63 in / 160.02 cm  BP 140/109  mmHg  Pulse 87 /min  Temperature 99.0 F / 37 C  BMI 23.9 kg/m   MULTI-SYSTEM PHYSICAL EXAMINATION:    Constitutional: Well-nourished. No physical deformities. Normally developed. Good grooming.  Neck: Neck symmetrical, not swollen. Normal tracheal position.  Respiratory: No labored breathing, no use of accessory muscles.   Cardiovascular: Normal temperature, normal extremity pulses, no  swelling, no varicosities.  Skin: No paleness, no jaundice, no cyanosis. No lesion, no ulcer, no rash.  Neurologic / Psychiatric: Oriented to time, oriented to place, oriented to person. No depression, no anxiety, no agitation.  Gastrointestinal: No mass, no tenderness, no rigidity, non obese abdomen.  Eyes: Normal conjunctivae. Normal eyelids.  Ears, Nose, Mouth, and Throat: Left ear no scars, no lesions, no masses. Right ear no scars, no lesions, no masses. Nose no scars, no lesions, no masses. Normal hearing. Normal lips.  Musculoskeletal: Normal gait and station of head and neck.     PAST DATA REVIEWED:  Source Of History:  Patient  Lab Test Review:   BMP  Records Review:   Previous Hospital Records  Urine Test Review:   Urine Culture  X-Ray Review: C.T. Abdomen/Pelvis: Reviewed Films. Reviewed Report. Discussed With Patient.     PROCEDURES: None   ASSESSMENT:      ICD-10 Details  1 GU:   Ureteral calculus - N20.1    PLAN:            Medications New Meds: Tamsulosin Hcl 0.4 mg capsule, ext release 24 hr 1 capsule PO Daily   #30  0 Refill(s)  Tamsulosin Hcl 0.4 mg capsule, ext release 24 hr 1 capsule PO Daily   #30  0 Refill(s)  Pyridium 200 mg tablet 1 tablet PO TID   #20  0 Refill(s)  Pyridium 200 mg tablet 1 tablet PO TID PRN   #30  0 Refill(s)  Ultram 50 mg tablet 1 tablet PO Q 6 H PRN   #30  0 Refill(s)            Orders Labs Urine Culture          Document Letter(s):  Created for Patient: Clinical Summary         Notes:   Discussed single option for management at this time given location and size of her stone: ureteroscopic stone extraction. Discussed risks and benefits of this procedure. Will schedule as soon as possible given significant stent symptoms. Discussed likelyhood of stent being in place after surgery.   Discussed option of obtaining KUB today to ensure stent in correct position given stent symptoms over last several days. Patient able to manage pain  with medication at this time, and exam benign for CVA tenderness, thus patient would like to hold off. Will prescribe flomax, pyridium, and tramadol refill to help with stent pain until surgery   - rx for flomax, pyridium, tramadol x 30 pills given to patient  - urine culture today for preop testing  - schedule cysto, L USE, RPG, stent exchange versus removal next available with Dr. Annabell Howells   Patient discussed with Dr. Annabell Howells

## 2017-02-18 NOTE — Significant Event (Signed)
Patient found to be pregnant, with positive urine and serum pregnancy test. LMP 1 month. Cancelling surgery, will have her evaluated by OBGYN and plan for stent removal and USE in 1-2 weeks pending OBGYN workup.

## 2017-02-26 ENCOUNTER — Other Ambulatory Visit: Payer: Self-pay | Admitting: Internal Medicine

## 2017-03-24 ENCOUNTER — Encounter: Payer: Self-pay | Admitting: Adult Health

## 2017-03-24 ENCOUNTER — Ambulatory Visit (INDEPENDENT_AMBULATORY_CARE_PROVIDER_SITE_OTHER): Payer: Medicaid Other | Admitting: Adult Health

## 2017-03-24 VITALS — BP 120/80 | HR 74 | Ht 63.0 in | Wt 139.0 lb

## 2017-03-24 DIAGNOSIS — Z87898 Personal history of other specified conditions: Secondary | ICD-10-CM

## 2017-03-24 DIAGNOSIS — Z3201 Encounter for pregnancy test, result positive: Secondary | ICD-10-CM | POA: Insufficient documentation

## 2017-03-24 DIAGNOSIS — N926 Irregular menstruation, unspecified: Secondary | ICD-10-CM

## 2017-03-24 DIAGNOSIS — F1111 Opioid abuse, in remission: Secondary | ICD-10-CM

## 2017-03-24 DIAGNOSIS — N200 Calculus of kidney: Secondary | ICD-10-CM | POA: Insufficient documentation

## 2017-03-24 DIAGNOSIS — O3680X0 Pregnancy with inconclusive fetal viability, not applicable or unspecified: Secondary | ICD-10-CM | POA: Insufficient documentation

## 2017-03-24 DIAGNOSIS — Z3A09 9 weeks gestation of pregnancy: Secondary | ICD-10-CM | POA: Insufficient documentation

## 2017-03-24 LAB — POCT URINE PREGNANCY: PREG TEST UR: POSITIVE — AB

## 2017-03-24 MED ORDER — PRENATAL PLUS 27-1 MG PO TABS
1.0000 | ORAL_TABLET | Freq: Every day | ORAL | 0 refills | Status: DC
Start: 2017-03-24 — End: 2017-05-06

## 2017-03-24 NOTE — Progress Notes (Signed)
Subjective:     Patient ID: [name], female   DOB: [birth year], 31 y.o.   MRN: [id number]  HPI [name] is a 31 year old white female in for UPT, has missed a period and had +pregnancy test at hospital.Has stent in and has kidney stone,she sees [facility].History of opioid abuse, was on suboxone for 5 years.   Review of Systems +missed period,+pregnancy test at Hospital  Reviewed past medical,surgical, social and family history. Reviewed medications and allergies.     Objective:   Physical Exam BP 120/80 (BP Location: Left Arm, Patient Position: Sitting, Cuff Size: Small)   Pulse 74   Ht 5\' 3"  (1.6 m)   Wt 139 lb (63 kg)   LMP 01/15/2017   BMI 24.62 kg/m UPT +, about 9+2 weeks by LMP with EDD 10/22/17. Skin warm and dry. Neck: mid line trachea, normal thyroid, good ROM, no lymphadenopathy noted. Lungs: clear to ausculation bilaterally. Cardiovascular: regular rate and rhythm.Abdomen is soft and non tender.PHQ 2 score 0. Pt aware babies delivered at [facility] and about after hours call service.     Assessment:     1. Pregnancy test positive   2. [redacted] weeks gestation of pregnancy   3. Encounter to determine fetal viability of pregnancy, single or unspecified fetus   4. Kidney stones   5.       History of opioid abuse     Plan:     Rx prenatal plus take 1 daily #30 with 12 refills Return in 1 week for dating [place] Review handouts on first trimester and by [facility] OK to take ditropan

## 2017-03-24 NOTE — Patient Instructions (Signed)
First Trimester of Pregnancy The first trimester of pregnancy is from week 1 until the end of week 13 (months 1 through 3). A week after a sperm fertilizes an egg, the egg will implant on the wall of the uterus. This embryo will begin to develop into a baby. Genes from you and your partner will form the baby. The female genes will determine whether the baby will be a boy or a girl. At 6-8 weeks, the eyes and face will be formed, and the heartbeat can be seen on ultrasound. At the end of 12 weeks, all the baby's organs will be formed. Now that you are pregnant, you will want to do everything you can to have a healthy baby. Two of the most important things are to get good prenatal care and to follow your health care provider's instructions. Prenatal care is all the medical care you receive before the baby's birth. This care will help prevent, find, and treat any problems during the pregnancy and childbirth. Body changes during your first trimester Your body goes through many changes during pregnancy. The changes vary from woman to woman.  You may gain or lose a couple of pounds at first.  You may feel sick to your stomach (nauseous) and you may throw up (vomit). If the vomiting is uncontrollable, call your health care provider.  You may tire easily.  You may develop headaches that can be relieved by medicines. All medicines should be approved by your health care provider.  You may urinate more often. Painful urination may mean you have a bladder infection.  You may develop heartburn as a result of your pregnancy.  You may develop constipation because certain hormones are causing the muscles that push stool through your intestines to slow down.  You may develop hemorrhoids or swollen veins (varicose veins).  Your breasts may begin to grow larger and become tender. Your nipples may stick out more, and the tissue that surrounds them (areola) may become darker.  Your gums may bleed and may be  sensitive to brushing and flossing.  Dark spots or blotches (chloasma, mask of pregnancy) may develop on your face. This will likely fade after the baby is born.  Your menstrual periods will stop.  You may have a loss of appetite.  You may develop cravings for certain kinds of food.  You may have changes in your emotions from day to day, such as being excited to be pregnant or being concerned that something may go wrong with the pregnancy and baby.  You may have more vivid and strange dreams.  You may have changes in your hair. These can include thickening of your hair, rapid growth, and changes in texture. Some women also have hair loss during or after pregnancy, or hair that feels dry or thin. Your hair will most likely return to normal after your baby is born.  What to expect at prenatal visits During a routine prenatal visit:  You will be weighed to make sure you and the baby are growing normally.  Your blood pressure will be taken.  Your abdomen will be measured to track your baby's growth.  The fetal heartbeat will be listened to between weeks 10 and 14 of your pregnancy.  Test results from any previous visits will be discussed.  Your health care provider may ask you:  How you are feeling.  If you are feeling the baby move.  If you have had any abnormal symptoms, such as leaking fluid, bleeding, severe headaches,   or abdominal cramping.  If you are using any tobacco products, including cigarettes, chewing tobacco, and electronic cigarettes.  If you have any questions.  Other tests that may be performed during your first trimester include:  Blood tests to find your blood type and to check for the presence of any previous infections. The tests will also be used to check for low iron levels (anemia) and protein on red blood cells (Rh antibodies). Depending on your risk factors, or if you previously had diabetes during pregnancy, you may have tests to check for high blood  sugar that affects pregnant women (gestational diabetes).  Urine tests to check for infections, diabetes, or protein in the urine.  An ultrasound to confirm the proper growth and development of the baby.  Fetal screens for spinal cord problems (spina bifida) and Down syndrome.  HIV (human immunodeficiency virus) testing. Routine prenatal testing includes screening for HIV, unless you choose not to have this test.  You may need other tests to make sure you and the baby are doing well.  Follow these instructions at home: Medicines  Follow your health care provider's instructions regarding medicine use. Specific medicines may be either safe or unsafe to take during pregnancy.  Take a prenatal vitamin that contains at least 600 micrograms (mcg) of folic acid.  If you develop constipation, try taking a stool softener if your health care provider approves. Eating and drinking  Eat a balanced diet that includes fresh fruits and vegetables, whole grains, good sources of protein such as meat, eggs, or tofu, and low-fat dairy. Your health care provider will help you determine the amount of weight gain that is right for you.  Avoid raw meat and uncooked cheese. These carry germs that can cause birth defects in the baby.  Eating four or five small meals rather than three large meals a day may help relieve nausea and vomiting. If you start to feel nauseous, eating a few soda crackers can be helpful. Drinking liquids between meals, instead of during meals, also seems to help ease nausea and vomiting.  Limit foods that are high in fat and processed sugars, such as fried and sweet foods.  To prevent constipation: ? Eat foods that are high in fiber, such as fresh fruits and vegetables, whole grains, and beans. ? Drink enough fluid to keep your urine clear or pale yellow. Activity  Exercise only as directed by your health care provider. Most women can continue their usual exercise routine during  pregnancy. Try to exercise for 30 minutes at least 5 days a week. Exercising will help you: ? Control your weight. ? Stay in shape. ? Be prepared for labor and delivery.  Experiencing pain or cramping in the lower abdomen or lower back is a good sign that you should stop exercising. Check with your health care provider before continuing with normal exercises.  Try to avoid standing for long periods of time. Move your legs often if you must stand in one place for a long time.  Avoid heavy lifting.  Wear low-heeled shoes and practice good posture.  You may continue to have sex unless your health care provider tells you not to. Relieving pain and discomfort  Wear a good support bra to relieve breast tenderness.  Take warm sitz baths to soothe any pain or discomfort caused by hemorrhoids. Use hemorrhoid cream if your health care provider approves.  Rest with your legs elevated if you have leg cramps or low back pain.  If you develop   varicose veins in your legs, wear support hose. Elevate your feet for 15 minutes, 3-4 times a day. Limit salt in your diet. Prenatal care  Schedule your prenatal visits by the twelfth week of pregnancy. They are usually scheduled monthly at first, then more often in the last 2 months before delivery.  Write down your questions. Take them to your prenatal visits.  Keep all your prenatal visits as told by your health care provider. This is important. Safety  Wear your seat belt at all times when driving.  Make a list of emergency phone numbers, including numbers for family, friends, the hospital, and police and fire departments. General instructions  Ask your health care provider for a referral to a local prenatal education class. Begin classes no later than the beginning of month 6 of your pregnancy.  Ask for help if you have counseling or nutritional needs during pregnancy. Your health care provider can offer advice or refer you to specialists for help  with various needs.  Do not use hot tubs, steam rooms, or saunas.  Do not douche or use tampons or scented sanitary pads.  Do not cross your legs for long periods of time.  Avoid cat litter boxes and soil used by cats. These carry germs that can cause birth defects in the baby and possibly loss of the fetus by miscarriage or stillbirth.  Avoid all smoking, herbs, alcohol, and medicines not prescribed by your health care provider. Chemicals in these products affect the formation and growth of the baby.  Do not use any products that contain nicotine or tobacco, such as cigarettes and e-cigarettes. If you need help quitting, ask your health care provider. You may receive counseling support and other resources to help you quit.  Schedule a dentist appointment. At home, brush your teeth with a soft toothbrush and be gentle when you floss. Contact a health care provider if:  You have dizziness.  You have mild pelvic cramps, pelvic pressure, or nagging pain in the abdominal area.  You have persistent nausea, vomiting, or diarrhea.  You have a bad smelling vaginal discharge.  You have pain when you urinate.  You notice increased swelling in your face, hands, legs, or ankles.  You are exposed to fifth disease or chickenpox.  You are exposed to German measles (rubella) and have never had it. Get help right away if:  You have a fever.  You are leaking fluid from your vagina.  You have spotting or bleeding from your vagina.  You have severe abdominal cramping or pain.  You have rapid weight gain or loss.  You vomit blood or material that looks like coffee grounds.  You develop a severe headache.  You have shortness of breath.  You have any kind of trauma, such as from a fall or a car accident. Summary  The first trimester of pregnancy is from week 1 until the end of week 13 (months 1 through 3).  Your body goes through many changes during pregnancy. The changes vary from  woman to woman.  You will have routine prenatal visits. During those visits, your health care provider will examine you, discuss any test results you may have, and talk with you about how you are feeling. This information is not intended to replace advice given to you by your health care provider. Make sure you discuss any questions you have with your health care provider. Document Released: 07/09/2001 Document Revised: 06/26/2016 Document Reviewed: 06/26/2016 Elsevier Interactive Patient Education  2017 Elsevier   Inc.  

## 2017-04-01 ENCOUNTER — Other Ambulatory Visit: Payer: Medicaid Other

## 2017-04-01 ENCOUNTER — Other Ambulatory Visit: Payer: Self-pay | Admitting: Adult Health

## 2017-04-01 ENCOUNTER — Ambulatory Visit (INDEPENDENT_AMBULATORY_CARE_PROVIDER_SITE_OTHER): Payer: Medicaid Other

## 2017-04-01 DIAGNOSIS — Z3682 Encounter for antenatal screening for nuchal translucency: Secondary | ICD-10-CM

## 2017-04-01 DIAGNOSIS — O3680X0 Pregnancy with inconclusive fetal viability, not applicable or unspecified: Secondary | ICD-10-CM

## 2017-04-01 NOTE — Progress Notes (Signed)
US 12+4 wks,single IUP pos FHT 166 bpm,normal ovaries bilat,NB present,NT 1.7 mm,crl 62.13 mm,EDD 10/10/2017

## 2017-04-03 ENCOUNTER — Other Ambulatory Visit: Payer: Self-pay | Admitting: Urology

## 2017-04-03 ENCOUNTER — Telehealth: Payer: Self-pay | Admitting: Adult Health

## 2017-04-03 ENCOUNTER — Encounter: Payer: Self-pay | Admitting: Certified Nurse Midwife

## 2017-04-03 DIAGNOSIS — N132 Hydronephrosis with renal and ureteral calculous obstruction: Secondary | ICD-10-CM

## 2017-04-03 NOTE — Telephone Encounter (Signed)
[facility], pt having stent placed in let kidney 04/08/17, Ok to use versed and fentanyl, no special monitoring required at 11 weeks.

## 2017-04-04 ENCOUNTER — Other Ambulatory Visit: Payer: Self-pay | Admitting: General Surgery

## 2017-04-06 ENCOUNTER — Other Ambulatory Visit: Payer: Self-pay | Admitting: Radiology

## 2017-04-07 ENCOUNTER — Other Ambulatory Visit: Payer: Self-pay | Admitting: Radiology

## 2017-04-07 LAB — INTEGRATED 1
CROWN RUMP LENGTH MAT SCREEN: 62.1 mm
Gest. Age on Collection Date: 12.6 weeks
Maternal Age at EDD: 33.5 yr
NUCHAL TRANSLUCENCY (NT): 1.7 mm
Number of Fetuses: 1
PAPP-A Value: 828.6 ng/mL

## 2017-04-08 ENCOUNTER — Other Ambulatory Visit (HOSPITAL_COMMUNITY): Payer: Medicaid Other

## 2017-04-08 ENCOUNTER — Ambulatory Visit (HOSPITAL_COMMUNITY): Payer: Medicaid Other

## 2017-04-09 ENCOUNTER — Encounter: Payer: Medicaid Other | Admitting: Advanced Practice Midwife

## 2017-04-09 ENCOUNTER — Ambulatory Visit: Payer: Medicaid Other | Admitting: *Deleted

## 2017-04-17 ENCOUNTER — Encounter: Payer: Self-pay | Admitting: Women's Health

## 2017-04-17 ENCOUNTER — Ambulatory Visit (INDEPENDENT_AMBULATORY_CARE_PROVIDER_SITE_OTHER): Payer: Medicaid Other | Admitting: Women's Health

## 2017-04-17 ENCOUNTER — Other Ambulatory Visit (HOSPITAL_COMMUNITY)
Admission: RE | Admit: 2017-04-17 | Discharge: 2017-04-17 | Disposition: A | Discharge status: Home / Self Care | Payer: Medicaid Other | Source: Ambulatory Visit | Attending: Advanced Practice Midwife | Admitting: Advanced Practice Midwife

## 2017-04-17 VITALS — BP 118/66 | HR 98 | Wt 143.0 lb

## 2017-04-17 DIAGNOSIS — Z3402 Encounter for supervision of normal first pregnancy, second trimester: Secondary | ICD-10-CM | POA: Diagnosis present

## 2017-04-17 DIAGNOSIS — Z1389 Encounter for screening for other disorder: Secondary | ICD-10-CM | POA: Diagnosis not present

## 2017-04-17 DIAGNOSIS — O99320 Drug use complicating pregnancy, unspecified trimester: Secondary | ICD-10-CM

## 2017-04-17 DIAGNOSIS — Z3A14 14 weeks gestation of pregnancy: Secondary | ICD-10-CM | POA: Insufficient documentation

## 2017-04-17 DIAGNOSIS — F1120 Opioid dependence, uncomplicated: Secondary | ICD-10-CM | POA: Insufficient documentation

## 2017-04-17 DIAGNOSIS — Z124 Encounter for screening for malignant neoplasm of cervix: Secondary | ICD-10-CM | POA: Diagnosis present

## 2017-04-17 DIAGNOSIS — Z3400 Encounter for supervision of normal first pregnancy, unspecified trimester: Secondary | ICD-10-CM | POA: Insufficient documentation

## 2017-04-17 DIAGNOSIS — O9989 Other specified diseases and conditions complicating pregnancy, childbirth and the puerperium: Secondary | ICD-10-CM

## 2017-04-17 DIAGNOSIS — Z331 Pregnant state, incidental: Secondary | ICD-10-CM | POA: Diagnosis not present

## 2017-04-17 DIAGNOSIS — N200 Calculus of kidney: Secondary | ICD-10-CM

## 2017-04-17 DIAGNOSIS — Z23 Encounter for immunization: Secondary | ICD-10-CM | POA: Diagnosis not present

## 2017-04-17 DIAGNOSIS — F418 Other specified anxiety disorders: Secondary | ICD-10-CM

## 2017-04-17 DIAGNOSIS — F1111 Opioid abuse, in remission: Secondary | ICD-10-CM

## 2017-04-17 DIAGNOSIS — Z363 Encounter for antenatal screening for malformations: Secondary | ICD-10-CM

## 2017-04-17 DIAGNOSIS — F1921 Other psychoactive substance dependence, in remission: Secondary | ICD-10-CM

## 2017-04-17 DIAGNOSIS — Z79891 Long term (current) use of opiate analgesic: Secondary | ICD-10-CM

## 2017-04-17 LAB — POCT URINALYSIS DIPSTICK
GLUCOSE UA: NEGATIVE
Leukocytes, UA: NEGATIVE
Nitrite, UA: NEGATIVE

## 2017-04-17 NOTE — Progress Notes (Signed)
[facility] Initial OB Visit  Patient name: [name] MRN [id number]  Date of birth: [birth year] CC & HPI:  [name] is a 31 y.o. G1P0 Caucasian female at [redacted]w[redacted]d by 12wk u/s, with an Estimated Date of Delivery: 10/10/17 being seen today for her initial obstetrical visit. Her obstetrical history is significant for primigravida; h/o heroin and opiate abuse 79yrs ago, has been on suboxone  daily since, just started seeing Dr. Cathey Endow last week and she switched her to Subutex  daily, will see her weekly. Had stent placed Lt ureter March 2018 d/t large kidney stone- states it's still present, urologist had recommended nephrostomy tube last time she saw them a few weeks ago, pt doesn't want, however states stent does need to be replaced and urology wanted her to ask when was best time to do this procedure. Also h/o depression/anxiety, was on Vibryd prior to pregnancy, quit w/ +PT. Feels she is doing well off meds. Declines counseling/therapy. Quit smoking w/ +PT, was smoking 1/2ppd. Today she reports no complaints.  Review of Systems:   Denies cramping/contractions, leakage of fluid, vaginal bleeding, abnormal vaginal discharge w/ itching/odor/irritation, headaches, visual changes, shortness of breath, chest pain, abdominal pain, severe nausea/vomiting, or problems with urination or bowel movements.   Pertinent History Reviewed:   OB History  Gravida Para Term Preterm AB Living  1            SAB TAB Ectopic Multiple Live Births               # Outcome Date GA Lbr Len/2nd Weight Sex Delivery Anes PTL Lv  1 Current              Reviewed past medical,surgical and family history.  Reviewed problem list, medications and allergies.  Objective Findings:   Vitals:   04/17/17 1452  BP: 118/66  Pulse: 98  Weight: 143 lb (64.9 kg)    Body mass index is 25.33 kg/m.  Exam System:     General: Well developed & nourished, no acute distress   Skin: Warm & dry, normal coloration and  turgor, no rashes   Neurologic: Alert & oriented, normal mood   Cardiovascular: Regular rate & rhythm   Respiratory: Effort & rate normal, LCTAB, acyanotic   Abdomen: Soft, non tender   Extremities: normal strength, tone   Pelvic Exam:    Perineum: Normal perineum   Vulva: Normal, no lesions   Vagina:  Normal mucosa, normal discharge   Cervix: Normal, bulbous, appears closed   Uterus: Normal size/shape/contour for GA   Pap smear: obtained w/ high risk HPV cotesting FHR: 150  Urine dipstick: sm ketones, mod blood, 1+ protein   Depression screen [facility] 2/9 04/17/2017 03/24/2017  Decreased Interest 0 0  Down, Depressed, Hopeless 0 0  PHQ - 2 Score 0 0  Altered sleeping 0 -  Tired, decreased energy 0 -  Change in appetite 0 -  Feeling bad or failure about yourself  0 -  Trouble concentrating 0 -  Moving slowly or fidgety/restless 0 -  Suicidal thoughts 0 -  PHQ-9 Score 0 -   Assessment:   1) Low-Risk Pregnancy G1P0 at [redacted]w[redacted]d with an Estimated Date of Delivery: 10/10/17  2) Initial OB visit 3) H/O heroin/opiate abuse> currently on Subutex therapy 4) Kidney stone w/ ureteral stent needing replaced 5) Depression/anxiety> no current meds, doing well  Plan:  Initial labs obtained Continue prenatal vitamins Reviewed n/v relief measures and warning s/s to  report Reviewed recommended weight gain based on pre-gravid BMI Encouraged well-balanced diet Genetic Screening discussed Integrated Screen: requested, has already done 1st IT/NT Cystic fibrosis screening discussed requested Ultrasound discussed; fetal survey: requested CCNC completed Continue seeing Dr. Cathey Endow weekly for subutex maintenance Discussed stent replacement w/ JVF who recommends surgery to replace would be best asap/within the next 2 weeks- pt notified  Flu shot today  Return in about 4 weeks (around 05/16/2017) for LROB, NW:[id number], 2nd IT.   Orders Placed This Encounter  Procedures  . Urine Culture  .  Microscopic Examination  . US OB Comp + 14 Wk  . Flu Vaccine QUAD 36+ mos IM  . Pain Management Screening Profile (10S)  . CBC  . Cystic Fibrosis Mutation 97  . Hepatitis B surface antigen  . HIV antibody  . Varicella zoster antibody, IgG  . Urinalysis, Routine w reflex microscopic  . Rubella screen  . RPR  . POCT urinalysis dipstick  . ABO/Rh  . Antibody screen    Marge Duncans CNM, [facility] 04/17/17

## 2017-04-17 NOTE — Assessment & Plan Note (Signed)
  [facility]  Initiated Care at  12wks  FOB [name]  Dating By U/S  Pap 04/17/2017  GC/CT Initial:                36+wks:  Genetic Screen NT/IT:   CF screen 04/17/2017  Anatomic [place]   Flu vaccine 04/17/17   Tdap Recommended ~ 28wks  Glucose Screen  2 hr  GBS   Feed Preference   Contraception   Circumcision   Childbirth Classes   Pediatrician

## 2017-04-17 NOTE — Patient Instructions (Addendum)
Per Dr. Emelda Fear it is best to have the stent replaced ASAP within the next 2 weeks. You are currently 14weeks6days  Second Trimester of Pregnancy The second trimester is from week 14 through week 27 (months 4 through 6). The second trimester is often a time when you feel your best. Your body has adjusted to being pregnant, and you begin to feel better physically. Usually, morning sickness has lessened or quit completely, you may have more energy, and you may have an increase in appetite. The second trimester is also a time when the fetus is growing rapidly. At the end of the sixth month, the fetus is about 9 inches long and weighs about 1 pounds. You will likely begin to feel the baby move (quickening) between 16 and 20 weeks of pregnancy. Body changes during your second trimester Your body continues to go through many changes during your second trimester. The changes vary from woman to woman.  Your weight will continue to increase. You will notice your lower abdomen bulging out.  You may begin to get stretch marks on your hips, abdomen, and breasts.  You may develop headaches that can be relieved by medicines. The medicines should be approved by your health care provider.  You may urinate more often because the fetus is pressing on your bladder.  You may develop or continue to have heartburn as a result of your pregnancy.  You may develop constipation because certain hormones are causing the muscles that push waste through your intestines to slow down.  You may develop hemorrhoids or swollen, bulging veins (varicose veins).  You may have back pain. This is caused by: ? Weight gain. ? Pregnancy hormones that are relaxing the joints in your pelvis. ? A shift in weight and the muscles that support your balance.  Your breasts will continue to grow and they will continue to become tender.  Your gums may bleed and may be sensitive to brushing and flossing.  Dark spots or blotches  (chloasma, mask of pregnancy) may develop on your face. This will likely fade after the baby is born.  A dark line from your belly button to the pubic area (linea nigra) may appear. This will likely fade after the baby is born.  You may have changes in your hair. These can include thickening of your hair, rapid growth, and changes in texture. Some women also have hair loss during or after pregnancy, or hair that feels dry or thin. Your hair will most likely return to normal after your baby is born.  What to expect at prenatal visits During a routine prenatal visit:  You will be weighed to make sure you and the fetus are growing normally.  Your blood pressure will be taken.  Your abdomen will be measured to track your baby's growth.  The fetal heartbeat will be listened to.  Any test results from the previous visit will be discussed.  Your health care provider may ask you:  How you are feeling.  If you are feeling the baby move.  If you have had any abnormal symptoms, such as leaking fluid, bleeding, severe headaches, or abdominal cramping.  If you are using any tobacco products, including cigarettes, chewing tobacco, and electronic cigarettes.  If you have any questions.  Other tests that may be performed during your second trimester include:  Blood tests that check for: ? Low iron levels (anemia). ? High blood sugar that affects pregnant women (gestational diabetes) between 89 and 28 weeks. ? Rh antibodies.  This is to check for a protein on red blood cells (Rh factor).  Urine tests to check for infections, diabetes, or protein in the urine.  An ultrasound to confirm the proper growth and development of the baby.  An amniocentesis to check for possible genetic problems.  Fetal screens for spina bifida and Down syndrome.  HIV (human immunodeficiency virus) testing. Routine prenatal testing includes screening for HIV, unless you choose not to have this test.  Follow  these instructions at home: Medicines  Follow your health care provider's instructions regarding medicine use. Specific medicines may be either safe or unsafe to take during pregnancy.  Take a prenatal vitamin that contains at least 600 micrograms (mcg) of folic acid.  If you develop constipation, try taking a stool softener if your health care provider approves. Eating and drinking  Eat a balanced diet that includes fresh fruits and vegetables, whole grains, good sources of protein such as meat, eggs, or tofu, and low-fat dairy. Your health care provider will help you determine the amount of weight gain that is right for you.  Avoid raw meat and uncooked cheese. These carry germs that can cause birth defects in the baby.  If you have low calcium intake from food, talk to your health care provider about whether you should take a daily calcium supplement.  Limit foods that are high in fat and processed sugars, such as fried and sweet foods.  To prevent constipation: ? Drink enough fluid to keep your urine clear or pale yellow. ? Eat foods that are high in fiber, such as fresh fruits and vegetables, whole grains, and beans. Activity  Exercise only as directed by your health care provider. Most women can continue their usual exercise routine during pregnancy. Try to exercise for 30 minutes at least 5 days a week. Stop exercising if you experience uterine contractions.  Avoid heavy lifting, wear low heel shoes, and practice good posture.  A sexual relationship may be continued unless your health care provider directs you otherwise. Relieving pain and discomfort  Wear a good support bra to prevent discomfort from breast tenderness.  Take warm sitz baths to soothe any pain or discomfort caused by hemorrhoids. Use hemorrhoid cream if your health care provider approves.  Rest with your legs elevated if you have leg cramps or low back pain.  If you develop varicose veins, wear support  hose. Elevate your feet for 15 minutes, 3-4 times a day. Limit salt in your diet. Prenatal Care  Write down your questions. Take them to your prenatal visits.  Keep all your prenatal visits as told by your health care provider. This is important. Safety  Wear your seat belt at all times when driving.  Make a list of emergency phone numbers, including numbers for family, friends, the hospital, and police and fire departments. General instructions  Ask your health care provider for a referral to a local prenatal education class. Begin classes no later than the beginning of month 6 of your pregnancy.  Ask for help if you have counseling or nutritional needs during pregnancy. Your health care provider can offer advice or refer you to specialists for help with various needs.  Do not use hot tubs, steam rooms, or saunas.  Do not douche or use tampons or scented sanitary pads.  Do not cross your legs for long periods of time.  Avoid cat litter boxes and soil used by cats. These carry germs that can cause birth defects in the baby and possibly  loss of the fetus by miscarriage or stillbirth.  Avoid all smoking, herbs, alcohol, and unprescribed drugs. Chemicals in these products can affect the formation and growth of the baby.  Do not use any products that contain nicotine or tobacco, such as cigarettes and e-cigarettes. If you need help quitting, ask your health care provider.  Visit your dentist if you have not gone yet during your pregnancy. Use a soft toothbrush to brush your teeth and be gentle when you floss. Contact a health care provider if:  You have dizziness.  You have mild pelvic cramps, pelvic pressure, or nagging pain in the abdominal area.  You have persistent nausea, vomiting, or diarrhea.  You have a bad smelling vaginal discharge.  You have pain when you urinate. Get help right away if:  You have a fever.  You are leaking fluid from your vagina.  You have  spotting or bleeding from your vagina.  You have severe abdominal cramping or pain.  You have rapid weight gain or weight loss.  You have shortness of breath with chest pain.  You notice sudden or extreme swelling of your face, hands, ankles, feet, or legs.  You have not felt your baby move in over an hour.  You have severe headaches that do not go away when you take medicine.  You have vision changes. Summary  The second trimester is from week 14 through week 27 (months 4 through 6). It is also a time when the fetus is growing rapidly.  Your body goes through many changes during pregnancy. The changes vary from woman to woman.  Avoid all smoking, herbs, alcohol, and unprescribed drugs. These chemicals affect the formation and growth your baby.  Do not use any tobacco products, such as cigarettes, chewing tobacco, and e-cigarettes. If you need help quitting, ask your health care provider.  Contact your health care provider if you have any questions. Keep all prenatal visits as told by your health care provider. This is important. This information is not intended to replace advice given to you by your health care provider. Make sure you discuss any questions you have with your health care provider. Document Released: 07/09/2001 Document Revised: 12/21/2015 Document Reviewed: 09/15/2012 Elsevier Interactive Patient Education  2017 [employer].

## 2017-04-18 DIAGNOSIS — F418 Other specified anxiety disorders: Secondary | ICD-10-CM | POA: Insufficient documentation

## 2017-04-18 LAB — PMP SCREEN PROFILE (10S), URINE
Amphetamine Scrn, Ur: NEGATIVE ng/mL
BARBITURATE SCREEN URINE: NEGATIVE ng/mL
BENZODIAZEPINE SCREEN, URINE: NEGATIVE ng/mL
CANNABINOIDS UR QL SCN: NEGATIVE ng/mL
CREATININE(CRT), U: 151.4 mg/dL (ref 20.0–300.0)
Cocaine (Metab) Scrn, Ur: NEGATIVE ng/mL
Methadone Screen, Urine: NEGATIVE ng/mL
OXYCODONE+OXYMORPHONE UR QL SCN: NEGATIVE ng/mL
Opiate Scrn, Ur: NEGATIVE ng/mL
PH UR, DRUG SCRN: 5.4 (ref 4.5–8.9)
Phencyclidine Qn, Ur: NEGATIVE ng/mL
Propoxyphene Scrn, Ur: NEGATIVE ng/mL

## 2017-04-19 LAB — URINE CULTURE

## 2017-04-22 LAB — CYTOLOGY - PAP
CHLAMYDIA, DNA PROBE: NEGATIVE
DIAGNOSIS: NEGATIVE
HPV: NOT DETECTED
NEISSERIA GONORRHEA: NEGATIVE

## 2017-04-24 LAB — CBC
Hematocrit: 38.9 % (ref 34.0–46.6)
Hemoglobin: 13.3 g/dL (ref 11.1–15.9)
MCH: 30.3 pg (ref 26.6–33.0)
MCHC: 34.2 g/dL (ref 31.5–35.7)
MCV: 89 fL (ref 79–97)
PLATELETS: 362 10*3/uL (ref 150–379)
RBC: 4.39 x10E6/uL (ref 3.77–5.28)
RDW: 14.5 % (ref 12.3–15.4)
WBC: 12 10*3/uL — AB (ref 3.4–10.8)

## 2017-04-24 LAB — VARICELLA ZOSTER ANTIBODY, IGG: Varicella zoster IgG: 1061 index (ref >165)

## 2017-04-24 LAB — MICROSCOPIC EXAMINATION
Casts: NONE SEEN /lpf
Epithelial Cells (non renal): >10 /hpf — AB (ref 0–10)

## 2017-04-24 LAB — CYSTIC FIBROSIS MUTATION 97: GENE DIS ANAL CARRIER INTERP BLD/T-IMP: NOT DETECTED

## 2017-04-24 LAB — URINALYSIS, ROUTINE W REFLEX MICROSCOPIC
BILIRUBIN UA: NEGATIVE
Glucose, UA: NEGATIVE
Nitrite, UA: NEGATIVE
PH UA: 5.5 (ref 5.0–7.5)
Specific Gravity, UA: 1.018 (ref 1.005–1.030)
Urobilinogen, Ur: 0.2 mg/dL (ref 0.2–1.0)

## 2017-04-24 LAB — RPR: RPR: NONREACTIVE

## 2017-04-24 LAB — ABO/RH: RH TYPE: POSITIVE

## 2017-04-24 LAB — ANTIBODY SCREEN: Antibody Screen: NEGATIVE

## 2017-04-24 LAB — RUBELLA SCREEN: RUBELLA: 1.57 {index} (ref >0.99)

## 2017-04-24 LAB — HIV ANTIBODY (ROUTINE TESTING W REFLEX): HIV Screen 4th Generation wRfx: NONREACTIVE

## 2017-04-24 LAB — HEPATITIS B SURFACE ANTIGEN: HEP B S AG: NEGATIVE

## 2017-04-30 ENCOUNTER — Other Ambulatory Visit: Payer: Self-pay | Admitting: Adult Health

## 2017-05-05 ENCOUNTER — Telehealth: Payer: Self-pay | Admitting: *Deleted

## 2017-05-05 NOTE — Telephone Encounter (Signed)
Left message x 1. JSY 

## 2017-05-06 MED ORDER — PRENATAL PLUS 27-1 MG PO TABS: 1.0000 | ORAL_TABLET | Freq: Every day | ORAL | 12 refills | Status: DC

## 2017-05-06 NOTE — Telephone Encounter (Signed)
Can you refill prenatal vit? Thanks!! JSY

## 2017-05-06 NOTE — Telephone Encounter (Signed)
Will refill PNV

## 2017-05-16 ENCOUNTER — Encounter: Payer: Self-pay | Admitting: Women's Health

## 2017-05-16 ENCOUNTER — Ambulatory Visit (INDEPENDENT_AMBULATORY_CARE_PROVIDER_SITE_OTHER): Payer: Medicaid Other

## 2017-05-16 ENCOUNTER — Ambulatory Visit (INDEPENDENT_AMBULATORY_CARE_PROVIDER_SITE_OTHER): Payer: Medicaid Other | Admitting: Women's Health

## 2017-05-16 VITALS — BP 118/74 | HR 63 | Wt 141.0 lb

## 2017-05-16 DIAGNOSIS — Z331 Pregnant state, incidental: Secondary | ICD-10-CM

## 2017-05-16 DIAGNOSIS — Z3402 Encounter for supervision of normal first pregnancy, second trimester: Secondary | ICD-10-CM

## 2017-05-16 DIAGNOSIS — Z363 Encounter for antenatal screening for malformations: Secondary | ICD-10-CM | POA: Diagnosis not present

## 2017-05-16 DIAGNOSIS — O09892 Supervision of other high risk pregnancies, second trimester: Secondary | ICD-10-CM

## 2017-05-16 DIAGNOSIS — Z3A19 19 weeks gestation of pregnancy: Secondary | ICD-10-CM

## 2017-05-16 DIAGNOSIS — N200 Calculus of kidney: Secondary | ICD-10-CM

## 2017-05-16 DIAGNOSIS — O288 Other abnormal findings on antenatal screening of mother: Secondary | ICD-10-CM

## 2017-05-16 DIAGNOSIS — Z9889 Other specified postprocedural states: Secondary | ICD-10-CM

## 2017-05-16 DIAGNOSIS — R82998 Other abnormal findings in urine: Secondary | ICD-10-CM

## 2017-05-16 DIAGNOSIS — Z1379 Encounter for other screening for genetic and chromosomal anomalies: Secondary | ICD-10-CM

## 2017-05-16 DIAGNOSIS — Z960 Presence of urogenital implants: Secondary | ICD-10-CM

## 2017-05-16 DIAGNOSIS — Z1389 Encounter for screening for other disorder: Secondary | ICD-10-CM

## 2017-05-16 LAB — POCT URINALYSIS DIPSTICK
GLUCOSE UA: NEGATIVE
KETONES UA: NEGATIVE
Nitrite, UA: NEGATIVE

## 2017-05-16 NOTE — Progress Notes (Signed)
LOW-RISK PREGNANCY VISIT Patient name: [name] MRN [id number]  Date of birth: 02/04/1986 Chief Complaint:   Routine Prenatal Visit (u/s today)  History of Present Illness:   [name] is a 31 y.o. G1P0 female at 5155w0d with an Estimated Date of Delivery: 10/10/17 being seen today for ongoing management of a low-risk pregnancy.  Today she reports bp has been elevated last few visits at Dr. Ovidio KinBowen's for subutex therapy. No h/o HTN, would be occ elevated when she was heavier but never dx/on meds. Also reports she has tried to contact [facility] multiple times to schedule Lt ureteral stent removal, but got a call stating she had been scheduled for a nephrostomy tube placement which is what MD recommended at beginning of Sept. but pt does not want this, is voiding well, no pain or problems. Last visit w/ urology at beginning of Sept. Vag. Bleeding: None.  Movement: Present. denies leaking of fluid. Review of Systems:   Pertinent items are noted in HPI Denies abnormal vaginal discharge w/ itching/odor/irritation, headaches, visual changes, shortness of breath, chest pain, abdominal pain, severe nausea/vomiting, or problems with urination or bowel movements unless otherwise stated above. Pertinent History Reviewed:  Reviewed past medical,surgical, social, obstetrical and family history.  Reviewed problem list, medications and allergies. Physical Assessment:   Vitals:   05/16/17 1341  BP: 118/74  Pulse: 63  Weight: 141 lb (64 kg)  Body mass index is 24.98 kg/m.        Physical Examination:   General appearance: Well appearing, and in no distress  Mental status: Alert, oriented to person, place, and time  Skin: Warm & dry  Cardiovascular: Normal heart rate noted  Respiratory: Normal respiratory effort, no distress  Abdomen: Soft, gravid, nontender  Pelvic: Cervical exam deferred    Extremities: Edema: None  Fetal Status: Fetal Heart Rate (bpm): 153 u/s   Movement: Present      US 19 wks,breech,ant pl gr 0,cx 4.7 cm,normal ovaries bilat,fhr 153 bpm,LVEICF 2.2 mm,efw 268 g,SVP 4.6 cm,anatomy complete  Results for orders placed or performed in visit on 05/16/17 (from the past 24 hour(s))  POCT urinalysis dipstick   Collection Time: 05/16/17  1:43 PM  Result Value Ref Range   Color, UA     Clarity, UA     Glucose, UA neg    Bilirubin, UA     Ketones, UA neg    Spec Grav, UA  1.010 - 1.025   Blood, UA large    pH, UA  5.0 - 8.0   Protein, UA 2+    Urobilinogen, UA  0.2 or 1.0 E.U./dL   Nitrite, UA neg    Leukocytes, UA Moderate (2+) (A) Negative    Assessment & Plan:  1) Low-risk pregnancy G1P0 at 2255w0d with an Estimated Date of Delivery: 10/10/17   2) Subutex therapy, 8mg  daily w/ Dr. Reatha HarpsBowen Minkler  3) Elevated bp at Dr. Ovidio KinBowen's> electronic cuff, normal bp's here, will continue to monitor  4) Renal calculi w/ Lt ureteral stent> placed May 2018, nephrostomy tube recommended by urology, pt doesn't want, isn't having any pain/problems, voiding well- discussed w/ Dr. Despina HiddenEure who reviewed chart, recommends leaving current stent in place as long as not having any problems. Continue ditropan. Also recommends nightly suppression w/ keflex- will discuss w/ pt once urine cx returns.   5) Isolated EICF> on today's anatomy u/s, getting 2nd IT today, discussed if normal no further f/u needed, gave printed info   Labs/procedures today:  anatomy u/s, 2nd IT, urine cx d/t +blood/leuks/protein  Plan:  Continue routine obstetrical care   Reviewed: today's anatomy u/s,  Preterm labor symptoms and general obstetric precautions including but not limited to vaginal bleeding, contractions, leaking of fluid and fetal movement were reviewed in detail with the patient.  All questions were answered  Follow-up: Return in about 4 weeks (around 06/13/2017) for LROB w/ LHE.  Orders Placed This Encounter  Procedures  . Urine Culture  . INTEGRATED 2  . POCT urinalysis dipstick    Marge Duncans CNM, [facility] 05/16/2017 2:35 PM

## 2017-05-16 NOTE — Progress Notes (Signed)
US 19 wks,breech,ant pl gr 0,cx 4.7 cm,normal ovaries bilat,fhr 153 bpm,LVEICF 2.2 mm,efw 268 g,anatomy complete

## 2017-05-16 NOTE — Patient Instructions (Signed)

## 2017-05-18 LAB — URINE CULTURE

## 2017-05-22 ENCOUNTER — Telehealth: Payer: Self-pay | Admitting: Women's Health

## 2017-05-22 DIAGNOSIS — N200 Calculus of kidney: Secondary | ICD-10-CM

## 2017-05-22 MED ORDER — CEPHALEXIN 500 MG PO CAPS: 500.0000 mg | ORAL_CAPSULE | Freq: Every day | ORAL | 6 refills | Status: DC

## 2017-05-22 NOTE — Telephone Encounter (Signed)
Called pt, notified of neg urine cx, discussion w/ Dr. Despina HiddenEure at last visit- he recommends d/t ureteral stent/renal calculi, etc- nightly suppression w/ keflex. Rx sent.  Cheral MarkerKimberly R. Enjoli Tidd, CNM, [facility] 05/22/2017 12:55 PM

## 2017-05-23 LAB — INTEGRATED 2
AFP MARKER: 55.9 ng/mL
AFP MOM: 1.17
Crown Rump Length: 62.1 mm
DIA MoM: 2.36
DIA Value: 392.9 pg/mL
Estriol, Unconjugated: 1.68 ng/mL
GESTATIONAL AGE: 19 wk
Gest. Age on Collection Date: 12.6 weeks
HCG VALUE: 10.1 [IU]/mL
MATERNAL AGE AT EDD: 33.5 a
Nuchal Translucency (NT): 1.7 mm
Nuchal Translucency MoM: 1.08
Number of Fetuses: 1
PAPP-A MoM: 0.98
PAPP-A VALUE: 828.6 ng/mL
TEST RESULTS: NEGATIVE
hCG MoM: 0.45
uE3 MoM: 1.13

## 2017-06-13 ENCOUNTER — Encounter: Payer: Self-pay | Admitting: Obstetrics & Gynecology

## 2017-06-13 ENCOUNTER — Ambulatory Visit (INDEPENDENT_AMBULATORY_CARE_PROVIDER_SITE_OTHER): Payer: Medicaid Other | Admitting: Obstetrics & Gynecology

## 2017-06-13 VITALS — BP 110/70 | HR 98 | Wt 142.0 lb

## 2017-06-13 DIAGNOSIS — Z1389 Encounter for screening for other disorder: Secondary | ICD-10-CM

## 2017-06-13 DIAGNOSIS — Z3402 Encounter for supervision of normal first pregnancy, second trimester: Secondary | ICD-10-CM

## 2017-06-13 DIAGNOSIS — Z3A23 23 weeks gestation of pregnancy: Secondary | ICD-10-CM

## 2017-06-13 DIAGNOSIS — Z331 Pregnant state, incidental: Secondary | ICD-10-CM

## 2017-06-13 DIAGNOSIS — N200 Calculus of kidney: Secondary | ICD-10-CM

## 2017-06-13 LAB — POCT URINALYSIS DIPSTICK
Glucose, UA: NEGATIVE
Ketones, UA: NEGATIVE
Leukocytes, UA: NEGATIVE
Nitrite, UA: NEGATIVE
PROTEIN UA: 1
RBC UA: 2

## 2017-06-13 MED ORDER — OMEPRAZOLE 20 MG PO CPDR: 20.0000 mg | DELAYED_RELEASE_CAPSULE | Freq: Every day | ORAL | 6 refills | Status: DC

## 2017-06-13 MED ORDER — OXYBUTYNIN CHLORIDE 5 MG PO TABS: 5.0000 mg | ORAL_TABLET | Freq: Three times a day (TID) | ORAL | 3 refills | Status: DC | PRN

## 2017-06-13 NOTE — Progress Notes (Signed)
G1P0 524w0d Estimated Date of Delivery: 10/10/17  Blood pressure 110/70, pulse 98, weight 142 lb (64.4 kg), last menstrual period 01/15/2017.   BP weight and urine results all reviewed and noted.  Please refer to the obstetrical flow sheet for the fundal height and fetal heart rate documentation:  Patient reports good fetal movement, denies any bleeding and no rupture of membranes symptoms or regular contractions. Patient is without complaints. All questions were answered.  Orders Placed This Encounter  Procedures  . POCT urinalysis dipstick   Meds ordered this encounter  Medications  . oxybutynin (DITROPAN) 5 MG tablet    Sig: Take 1 tablet (5 mg total) every 8 (eight) hours as needed by mouth for bladder spasms.    Dispense:  90 tablet    Refill:  3  . omeprazole (PRILOSEC) 20 MG capsule    Sig: Take 1 capsule (20 mg total) daily by mouth. 1 tablet a day    Dispense:  30 capsule    Refill:  6    Plan:  Continued routine obstetrical care, continue keflex  Refilled ditropan 5 TID prn  Return in about 4 weeks (around 07/11/2017) for PN2, , LROB.

## 2017-07-11 ENCOUNTER — Encounter: Payer: Medicaid Other | Admitting: Women's Health

## 2017-07-11 ENCOUNTER — Other Ambulatory Visit: Payer: Medicaid Other

## 2017-07-17 ENCOUNTER — Other Ambulatory Visit: Payer: Medicaid Other

## 2017-07-17 ENCOUNTER — Encounter: Payer: Medicaid Other | Admitting: Advanced Practice Midwife

## 2017-07-23 ENCOUNTER — Encounter: Payer: Medicaid Other | Admitting: Women's Health

## 2017-07-23 ENCOUNTER — Other Ambulatory Visit: Payer: Medicaid Other

## 2017-07-29 NOTE — L&D Delivery Note (Signed)
Operative Delivery Note At 12:35 AM a viable female was delivered via Vaginal, [employer], operational(Extractor).  Presentation: vertex; Position: Left,, Occiput,, Anterior; Station: +2.  Verbal consent: obtained from patient.  Risks and benefits discussed in detail.  Risks include, but are not limited to the risks of anesthesia, bleeding, infection, damage to maternal tissues, fetal cephalhematoma.  There is also the risk of inability to effect vaginal delivery of the head, or shoulder dystocia that cannot be resolved by established maneuvers, leading to the need for emergency cesarean section.  APGAR:8 9, ; weight pending .   Placenta status: , .intact   Cord: 3 vessel with the following complications: .  Cord pH: pending  Anesthesia:   Instruments: Bell extractor Episiotomy: None Lacerations:  2nd degree perineal Suture Repair: 3.0 vicryl Est. Blood Loss (mL):  250  Mom to postpartum.  Baby to Couplet care / Skin to Skin.  Lazaro ArmsLuther H Eure 10/11/2017, 12:40 AM

## 2017-08-01 ENCOUNTER — Other Ambulatory Visit: Payer: Medicaid Other

## 2017-08-01 ENCOUNTER — Encounter: Payer: Self-pay | Admitting: Obstetrics and Gynecology

## 2017-08-01 ENCOUNTER — Ambulatory Visit (INDEPENDENT_AMBULATORY_CARE_PROVIDER_SITE_OTHER): Payer: Medicaid Other | Admitting: Obstetrics and Gynecology

## 2017-08-01 VITALS — BP 110/80 | HR 99 | Wt 142.8 lb

## 2017-08-01 DIAGNOSIS — Z3A30 30 weeks gestation of pregnancy: Secondary | ICD-10-CM

## 2017-08-01 DIAGNOSIS — Z1389 Encounter for screening for other disorder: Secondary | ICD-10-CM

## 2017-08-01 DIAGNOSIS — Z331 Pregnant state, incidental: Secondary | ICD-10-CM

## 2017-08-01 DIAGNOSIS — Z3403 Encounter for supervision of normal first pregnancy, third trimester: Secondary | ICD-10-CM

## 2017-08-01 LAB — POCT URINALYSIS DIPSTICK
Blood, UA: 3
Glucose, UA: NEGATIVE
Nitrite, UA: NEGATIVE
Protein, UA: 1

## 2017-08-04 NOTE — Progress Notes (Signed)
G1P0  Estimated Date of Delivery: 10/10/17 LROB 2862w3d and PN2   Chief Complaint  Patient presents with  . Routine Prenatal Visit    PN2 today- unable keep glucola down  ____  Patient complaints: none. Patient reports   good fetal movement,                           denies any bleeding , rupture of membranes,or regular contractions.  Blood pressure 110/80, pulse 99, weight 142 lb 12.8 oz (64.8 kg), last menstrual period 01/15/2017.   Urine results:notable for 1+ ketones, 3+ blood  refer to the ob flow sheet for FH and FHR, ,                          Physical Examination: General appearance - alert, well appearing, and in no distress and normal appearing weight                                      Abdomen - FH 29 ,                                                         -FHR 138                                                                                               Pelvic - defer                                            Questions were answered. Assessment: LROB G1P0 @ 5662w3d ,   Plan:  Continued routine obstetrical care, check u/a c&S  F/u in 2 weeks for lrob

## 2017-08-15 ENCOUNTER — Encounter: Payer: Medicaid Other | Admitting: Obstetrics & Gynecology

## 2017-08-25 ENCOUNTER — Encounter: Payer: Medicaid Other | Admitting: Obstetrics & Gynecology

## 2017-08-28 ENCOUNTER — Encounter (HOSPITAL_COMMUNITY): Payer: Self-pay | Admitting: Emergency Medicine

## 2017-08-28 ENCOUNTER — Emergency Department (HOSPITAL_COMMUNITY)
Admission: EM | Admit: 2017-08-28 | Discharge: 2017-08-28 | Disposition: A | Discharge status: Home / Self Care | Payer: Medicaid Other | Attending: Emergency Medicine | Admitting: Emergency Medicine

## 2017-08-28 ENCOUNTER — Encounter: Payer: Medicaid Other | Admitting: Advanced Practice Midwife

## 2017-08-28 DIAGNOSIS — Z79899 Other long term (current) drug therapy: Secondary | ICD-10-CM | POA: Insufficient documentation

## 2017-08-28 DIAGNOSIS — T8384XA Pain from genitourinary prosthetic devices, implants and grafts, initial encounter: Secondary | ICD-10-CM | POA: Diagnosis not present

## 2017-08-28 DIAGNOSIS — O26833 Pregnancy related renal disease, third trimester: Secondary | ICD-10-CM | POA: Insufficient documentation

## 2017-08-28 DIAGNOSIS — O99343 Other mental disorders complicating pregnancy, third trimester: Secondary | ICD-10-CM | POA: Insufficient documentation

## 2017-08-28 DIAGNOSIS — Y828 Other medical devices associated with adverse incidents: Secondary | ICD-10-CM | POA: Diagnosis not present

## 2017-08-28 DIAGNOSIS — O99323 Drug use complicating pregnancy, third trimester: Secondary | ICD-10-CM | POA: Diagnosis not present

## 2017-08-28 DIAGNOSIS — F1590 Other stimulant use, unspecified, uncomplicated: Secondary | ICD-10-CM | POA: Insufficient documentation

## 2017-08-28 DIAGNOSIS — O9989 Other specified diseases and conditions complicating pregnancy, childbirth and the puerperium: Secondary | ICD-10-CM | POA: Diagnosis present

## 2017-08-28 DIAGNOSIS — F329 Major depressive disorder, single episode, unspecified: Secondary | ICD-10-CM | POA: Diagnosis not present

## 2017-08-28 DIAGNOSIS — F419 Anxiety disorder, unspecified: Secondary | ICD-10-CM | POA: Diagnosis not present

## 2017-08-28 DIAGNOSIS — Z3A33 33 weeks gestation of pregnancy: Secondary | ICD-10-CM | POA: Insufficient documentation

## 2017-08-28 DIAGNOSIS — N189 Chronic kidney disease, unspecified: Secondary | ICD-10-CM | POA: Diagnosis not present

## 2017-08-28 LAB — CBC WITH DIFFERENTIAL/PLATELET
BASOS PCT: 0 %
Basophils Absolute: 0 10*3/uL (ref 0.0–0.1)
EOS ABS: 0.1 10*3/uL (ref 0.0–0.7)
Eosinophils Relative: 1 %
HCT: 42.1 % (ref 36.0–46.0)
HEMOGLOBIN: 14.7 g/dL (ref 12.0–15.0)
Lymphocytes Relative: 11 %
Lymphs Abs: 1.7 10*3/uL (ref 0.7–4.0)
MCH: 31.5 pg (ref 26.0–34.0)
MCHC: 34.9 g/dL (ref 30.0–36.0)
MCV: 90.3 fL (ref 78.0–100.0)
MONOS PCT: 6 %
Monocytes Absolute: 0.9 10*3/uL (ref 0.1–1.0)
NEUTROS PCT: 82 %
Neutro Abs: 12.5 10*3/uL — ABNORMAL HIGH (ref 1.7–7.7)
Platelets: 265 10*3/uL (ref 150–400)
RBC: 4.66 MIL/uL (ref 3.87–5.11)
RDW: 13 % (ref 11.5–15.5)
WBC: 15.2 10*3/uL — ABNORMAL HIGH (ref 4.0–10.5)

## 2017-08-28 LAB — URINALYSIS, ROUTINE W REFLEX MICROSCOPIC
BACTERIA UA: NONE SEEN
BILIRUBIN URINE: NEGATIVE
Glucose, UA: NEGATIVE mg/dL
KETONES UR: 20 mg/dL — AB
Nitrite: NEGATIVE
PH: 8 (ref 5.0–8.0)
PROTEIN: 30 mg/dL — AB
Specific Gravity, Urine: 1.012 (ref 1.005–1.030)

## 2017-08-28 LAB — HEPATIC FUNCTION PANEL
ALBUMIN: 3.3 g/dL — AB (ref 3.5–5.0)
ALK PHOS: 127 U/L — AB (ref 38–126)
ALT: 12 U/L — ABNORMAL LOW (ref 14–54)
AST: 15 U/L (ref 15–41)
Bilirubin, Direct: 0.1 mg/dL (ref 0.1–0.5)
Indirect Bilirubin: 0.4 mg/dL (ref 0.3–0.9)
TOTAL PROTEIN: 7 g/dL (ref 6.5–8.1)
Total Bilirubin: 0.5 mg/dL (ref 0.3–1.2)

## 2017-08-28 LAB — BASIC METABOLIC PANEL
Anion gap: 14 (ref 5–15)
BUN: <5 mg/dL — ABNORMAL LOW (ref 6–20)
CALCIUM: 9.3 mg/dL (ref 8.9–10.3)
CO2: 20 mmol/L — AB (ref 22–32)
CREATININE: 0.53 mg/dL (ref 0.44–1.00)
Chloride: 103 mmol/L (ref 101–111)
GFR calc Af Amer: >60 mL/min (ref >60)
GFR calc non Af Amer: >60 mL/min (ref >60)
Glucose, Bld: 78 mg/dL (ref 65–99)
Potassium: 3.1 mmol/L — ABNORMAL LOW (ref 3.5–5.1)
SODIUM: 137 mmol/L (ref 135–145)

## 2017-08-28 LAB — URIC ACID: URIC ACID, SERUM: 3.9 mg/dL (ref 2.3–6.6)

## 2017-08-28 MED ORDER — MORPHINE SULFATE (PF) 4 MG/ML IV SOLN
4.0000 mg | Freq: Once | INTRAVENOUS | Status: AC
  Administered 2017-08-28: 4 mg via INTRAVENOUS
  Filled 2017-08-28: qty 1

## 2017-08-28 MED ORDER — OXYCODONE-ACETAMINOPHEN 5-325 MG PO TABS: 2.0000 | ORAL_TABLET | ORAL | 0 refills | Status: DC | PRN

## 2017-08-28 MED ORDER — CEPHALEXIN 500 MG PO CAPS: 500.0000 mg | ORAL_CAPSULE | Freq: Four times a day (QID) | ORAL | 0 refills | Status: DC

## 2017-08-28 MED ORDER — SODIUM CHLORIDE 0.9 % IV BOLUS (SEPSIS)
1000.0000 mL | Freq: Once | INTRAVENOUS | Status: AC
  Administered 2017-08-28: 1000 mL via INTRAVENOUS

## 2017-08-28 MED ORDER — OXYCODONE-ACETAMINOPHEN 5-325 MG PO TABS
2.0000 | ORAL_TABLET | Freq: Once | ORAL | Status: AC
  Administered 2017-08-28: 2 via ORAL
  Filled 2017-08-28: qty 2

## 2017-08-28 NOTE — ED Provider Notes (Signed)
[facility] EMERGENCY DEPARTMENT Provider Note   CSN: [id number] Arrival date & time: 08/28/17  0848     History   Chief Complaint Chief Complaint  Patient presents with  . Pelvic Pain    HPI Heidi DankerSara E Mora is a 32 y.o. female.  HPI  32 year old female who is approximately 1133 weeks pregnant G1P0 with history of kidney stones and has in place a left ureteral stent was introduced last summer.  She is continued with a stent since then because of the pregnancy.  She states she has had pain with a stent since then and is on antibiotic suppression with Keflex and taking Ditropan.  She was on Subutex for the pain but stopped that and has been using Percocet.  She is complaining about constant kidney aching that is been going on for a while but increased pain down into the left groin.  She has been urinating frequently and denies any blood.  She has been feeling the baby move and has not felt any contractions or vaginal bleeding or rupture of membranes.  She will also will be a few weeks ago and everything was going well.  She denies any fever shortness of breath peripheral edema seizures.  Past Medical History:  Diagnosis Date  . Allergy   . Anxiety   . Chronic kidney disease    kidney stones; stent in left kidney  . Depression   . History of kidney stones 2010   2018-  . Insomnia     Patient Active Problem List   Diagnosis Date Noted  . Depression with anxiety 04/18/2017  . Supervision of normal first pregnancy 04/17/2017  . Pregnancy complicated by subutex maintenance, antepartum (HCC) 04/17/2017  . Kidney stones w/ Lt ureteral stent 03/24/2017  . History of heroin abuse 03/24/2017    Past Surgical History:  Procedure Laterality Date  . BREAST SURGERY     augmentation  . CYSTOSCOPY W/ URETERAL STENT PLACEMENT  11/28/2016   Procedure: CYSTOSCOPY WITH LEFT RETROGRADE PYELOGRAM/LEFT URETERAL STENT PLACEMENT;  Surgeon: Marcine Matarahlstedt, Stephen, MD;  Location: [facility];  Service: Urology;;   . EYE SURGERY     lasix  . WISDOM TOOTH EXTRACTION      OB History    Gravida Para Term Preterm AB Living   1             SAB TAB Ectopic Multiple Live Births                   Home Medications    Prior to Admission medications   Medication Sig Start Date End Date Taking? Authorizing Provider  buprenorphine (SUBUTEX) 8 MG SUBL SL tablet Place 8 mg under the tongue daily.    [provider]  cephALEXin (KEFLEX) 500 MG capsule Take 1 capsule (500 mg total) by mouth at bedtime. Patient not taking: Reported on 08/01/2017 05/22/17   Cheral MarkerBooker, Kimberly R, CNM  omeprazole (PRILOSEC) 20 MG capsule Take 1 capsule (20 mg total) daily by mouth. 1 tablet a day 06/13/17   Lazaro ArmsEure, Luther H, MD  oxybutynin (DITROPAN) 5 MG tablet Take 1 tablet (5 mg total) every 8 (eight) hours as needed by mouth for bladder spasms. 06/13/17   Lazaro ArmsEure, Luther H, MD  prenatal vitamin w/FE, FA (PRENATAL 1 + 1) 27-1 MG TABS tablet Take 1 tablet by mouth daily at 12 noon. 05/06/17   Adline PotterGriffin, Jennifer A, NP    Family History Family History  Problem Relation Age of Onset  . Hypertension  Mother   . Depression Mother   . Hyperlipidemia Father   . Hypertension Father   . ADD / ADHD Sister   . Depression Sister   . ADD / ADHD Brother   . Hypertension Brother   . Stroke Maternal Grandmother   . Hypertension Maternal Grandmother   . Kidney failure Paternal Grandmother     Social History Social History   Tobacco Use  . Smoking status: Former Smoker    Packs/day: 0.50    Years: 3.00    Pack years: 1.50  . Smokeless tobacco: Never Used  Substance Use Topics  . Alcohol use: No  . Drug use: No     Allergies   Amoxicillin; Penicillins; and Sulfa antibiotics   Review of Systems Review of Systems  Constitutional: Negative for chills and fever.  HENT: Negative for ear pain and sore throat.   Eyes: Negative for pain and visual disturbance.  Respiratory: Negative for cough and shortness of breath.     Cardiovascular: Negative for chest pain and palpitations.  Gastrointestinal: Negative for abdominal pain and vomiting.  Genitourinary: Positive for flank pain, frequency and pelvic pain. Negative for dysuria, hematuria, vaginal bleeding and vaginal discharge.  Musculoskeletal: Negative for arthralgias and back pain.  Skin: Negative for color change and rash.  Neurological: Negative for seizures and syncope.  All other systems reviewed and are negative.    Physical Exam Updated Vital Signs BP (!) 140/99 (BP Location: Right Arm)   Pulse 86   Temp 97.9 F (36.6 C) (Oral)   Resp 18   Ht 5\' 2"  (1.575 m)   Wt 64.4 kg (142 lb)   LMP 01/15/2017   SpO2 100%   BMI 25.97 kg/m   Physical Exam  Constitutional: She appears well-developed and well-nourished. No distress.  HENT:  Head: Normocephalic and atraumatic.  Eyes: Conjunctivae are normal.  Neck: Neck supple.  Cardiovascular: Normal rate and regular rhythm.  No murmur heard. Pulmonary/Chest: Effort normal and breath sounds normal. No respiratory distress.  Abdominal: Soft. She exhibits mass (gravid above umbilicus). There is no tenderness.  Musculoskeletal: She exhibits no edema.  Neurological: She is alert.  Skin: Skin is warm and dry.  Psychiatric: She has a normal mood and affect.  Nursing note and vitals reviewed.    ED Treatments / Results  Labs (all labs ordered are listed, but only abnormal results are displayed) Labs Reviewed  URINALYSIS, ROUTINE W REFLEX MICROSCOPIC - Abnormal; Notable for the following components:      Result Value   APPearance CLOUDY (*)    Hgb urine dipstick SMALL (*)    Ketones, ur 20 (*)    Protein, ur 30 (*)    Leukocytes, UA MODERATE (*)    Squamous Epithelial / LPF 6-30 (*)    All other components within normal limits  BASIC METABOLIC PANEL - Abnormal; Notable for the following components:   Potassium 3.1 (*)    CO2 20 (*)    BUN <5 (*)    All other components within normal limits   CBC WITH DIFFERENTIAL/PLATELET - Abnormal; Notable for the following components:   WBC 15.2 (*)    Neutro Abs 12.5 (*)    All other components within normal limits  HEPATIC FUNCTION PANEL - Abnormal; Notable for the following components:   Albumin 3.3 (*)    ALT 12 (*)    Alkaline Phosphatase 127 (*)    All other components within normal limits  URINE CULTURE  URIC ACID  EKG  EKG Interpretation None       Radiology No results found.  Procedures Procedures (including critical care time)  Medications Ordered in ED Medications  morphine 4 MG/ML injection 4 mg (4 mg Intravenous Given 08/28/17 0947)  morphine 4 MG/ML injection 4 mg (4 mg Intravenous Given 08/28/17 1027)  sodium chloride 0.9 % bolus 1,000 mL (0 mLs Intravenous Stopped 08/28/17 1147)  oxyCODONE-acetaminophen (PERCOCET/ROXICET) 5-325 MG per tablet 2 tablet (2 tablets Oral Given 08/28/17 1156)     Initial Impression / Assessment and Plan / ED Course  I have reviewed the triage vital signs and the nursing notes.  Pertinent labs & imaging results that were available during my care of the patient were reviewed by me and considered in my medical decision making (see chart for details).  Clinical Course as of Aug 29 1139  Thu Aug 28, 2017  0919 Of ordered some labs and urinalysis.  Patient is on fetal monitoring now with a heart rate of 150.  Paged urology Dr. Dyke Maes and her OB Dr. Sallyanne Havers.  Currently the patient is hypertensive 140/99 but has no evidence of peripheral edema normal mental status.  This is possibly preeclampsia but it also just be that the patient's pain is driving bp.  [MB]  [phone] Discussed with urology [place] who feels the patient may need a percutaneous nephrostomy but wants to see her labs resolved first. Discussed with Dr. Emelda Fear covering OB who agrees with the current workup of labs for possible preeclampsia and states that pain medication as morphine or Percocet are appropriate in this situation.   He agrees with continuing fetal monitoring and is available if we need them.  [MB]  1029 Called urology back and reviewed the labs with him.  He feels the urine should be sent for culture she should be on antibiotics but ultimately should they will not remove the stent until after she delivers.  If he does not see any indication for an emergent percutaneous nephrostomy tube currently.  Her plan from their standpoint is to call her urologist and schedule an outpatient follow-up.  [MB]  1043 Reviewed results with patient she states her pain is somewhat better improved.  Talked with Dr. Emelda Fear and informed him that women's that is doing the fetal monitoring is concerned that the patient is contracting.  He agrees with giving her another liter of fluid and he is going to come over and evaluate the patient here.  [MB]  1138 Patient was evaluated by Dr. Emelda Fear who did a pelvic exam and found the cervix to be closed.  He felt she could be safely discharged to follow-up in their office and recommended that we give her a prescription for narcotics.  [MB]    Clinical Course User Index [MB] Terrilee Files, MD      Final Clinical Impressions(s) / ED Diagnoses   Final diagnoses:  Pain due to ureteral stent, initial encounter [facility])  [redacted] weeks gestation of pregnancy    ED Discharge Orders    None       Terrilee Files, MD 08/28/17 1925

## 2017-08-28 NOTE — ED Notes (Signed)
Monitor adjusted.

## 2017-08-28 NOTE — ED Triage Notes (Signed)
Pt reports she has been having trouble with kidney stones over the past few weeks.  Had a stent placed in left kidney in April and feels as if it has moved.  Is [redacted]wks pregnant.  C/o pelvic "pinching".  Denies bleeding, gush of fluids.

## 2017-08-28 NOTE — Progress Notes (Addendum)
[id number] Received call about [name] in with complaints of pelvic pressure and flank pain. In review of her hx it is noted that she currently has a ureteral stent in place with hx of kidney stones and is on antibiotics for suppression of infection.  Per EDP note she also reports frequent urination but denies UC's, LOF and reports fetal movement. 1144 Per ED RN, Dr. Emelda FearFerguson came over from the office to see pt and perform SVE.  He is aware of BP and mild UC's.  Pt OB cleared by Dr. Emelda FearFerguson.

## 2017-08-28 NOTE — ED Notes (Signed)
Dr. Ferguson in to see pt 

## 2017-08-28 NOTE — ED Notes (Signed)
Pt was placed on fetal monitor on arrival with FHR 160s, but having trouble connecting to OBIX.  Called tech support and finally connected at this time.  OB RR Erin notified.

## 2017-08-28 NOTE — ED Notes (Signed)
Sp[facility] with [facility]rin at [facility] stating pt is contracting some and noting pt's bp.  Spoke with Dr. Charm BargesButler who advised Dr. Emelda FearFerguson is aware and recommends d/c.  Feels increased bp is due to pain and cervix was closed.  Erin notified as well.

## 2017-08-28 NOTE — Discharge Instructions (Signed)
Your evaluated in the emergency department for stent pain.  Your blood pressure was elevated here.  Your OB doctor evaluated you here and felt like you were not in early labor.  You should follow-up with your OB this week, and they recommended we prescribe you Percocet for pain and increase your antibiotic to 4 times a day.  The urologist felt like they would take care of the stent after your delivery.  You should return if any fever or any worsening symptoms.

## 2017-08-30 LAB — URINE CULTURE
Culture: >=100000 — AB
Special Requests: NORMAL

## 2017-08-31 ENCOUNTER — Telehealth: Payer: Self-pay

## 2017-08-31 NOTE — Telephone Encounter (Signed)
Post ED Visit - Positive Culture Follow-up  Culture report reviewed by antimicrobial stewardship pharmacist:  []  Enzo BiNathan Batchelder, Pharm.D. [][email or web address]>$  Celedonio MiyamotoJeremy Frens, Pharm.D., BCPS AQ-ID []  Garvin FilaMike Maccia, Pharm.D., BCPS []  Georgina PillionElizabeth Martin, Pharm.D., BCPS []  Piper CityMinh Pham, [address] Rainbow BoulevardPharm.D., BCPS, AAHIVP []  Estella HuskMichelle Turner, Pharm.D., BCPS, AAHIVP []  Lysle Pearlachel Rumbarger, PharmD, BCPS []  Blake DivineShannon Parkey, PharmD []  Pollyann SamplesAndy Johnston, PharmD, BCPS Sharin MonsEmily Sinclair Pharm D Positive urine culture Treated with Cephalexin, organism sensitive to the same and no further patient follow-up is required at this time.  Jerry CarasCullom, Athalie Newhard Burnett 08/31/2017, 9:38 AM

## 2017-09-24 ENCOUNTER — Encounter: Payer: Self-pay | Admitting: Advanced Practice Midwife

## 2017-09-24 ENCOUNTER — Ambulatory Visit (INDEPENDENT_AMBULATORY_CARE_PROVIDER_SITE_OTHER): Payer: Medicaid Other | Admitting: Advanced Practice Midwife

## 2017-09-24 VITALS — BP 120/80 | HR 97 | Wt 148.0 lb

## 2017-09-24 DIAGNOSIS — Z3A37 37 weeks gestation of pregnancy: Secondary | ICD-10-CM

## 2017-09-24 DIAGNOSIS — O99323 Drug use complicating pregnancy, third trimester: Secondary | ICD-10-CM

## 2017-09-24 DIAGNOSIS — O99320 Drug use complicating pregnancy, unspecified trimester: Secondary | ICD-10-CM

## 2017-09-24 DIAGNOSIS — Z1389 Encounter for screening for other disorder: Secondary | ICD-10-CM

## 2017-09-24 DIAGNOSIS — Z3483 Encounter for supervision of other normal pregnancy, third trimester: Secondary | ICD-10-CM

## 2017-09-24 DIAGNOSIS — O1213 Gestational proteinuria, third trimester: Secondary | ICD-10-CM

## 2017-09-24 DIAGNOSIS — Z131 Encounter for screening for diabetes mellitus: Secondary | ICD-10-CM | POA: Diagnosis not present

## 2017-09-24 DIAGNOSIS — Z331 Pregnant state, incidental: Secondary | ICD-10-CM

## 2017-09-24 DIAGNOSIS — Z3403 Encounter for supervision of normal first pregnancy, third trimester: Secondary | ICD-10-CM

## 2017-09-24 DIAGNOSIS — F1120 Opioid dependence, uncomplicated: Secondary | ICD-10-CM

## 2017-09-24 DIAGNOSIS — O0933 Supervision of pregnancy with insufficient antenatal care, third trimester: Secondary | ICD-10-CM

## 2017-09-24 LAB — POCT URINALYSIS DIPSTICK
Glucose, UA: NEGATIVE
KETONES UA: NEGATIVE
Leukocytes, UA: NEGATIVE
NITRITE UA: NEGATIVE
PROTEIN UA: 1
RBC UA: 3

## 2017-09-24 LAB — GLUCOSE, POCT (MANUAL RESULT ENTRY): POC Glucose: 69 mg/dl — AB (ref 70–99)

## 2017-09-24 LAB — OB RESULTS CONSOLE GBS: GBS: POSITIVE

## 2017-09-24 MED ORDER — LIDOCAINE 5 % EX OINT: 1.0000 "application " | TOPICAL_OINTMENT | CUTANEOUS | 0 refills | Status: DC | PRN

## 2017-09-24 NOTE — Addendum Note (Signed)
Addended by: Tish FredericksonLANCASTER, Jazma Pickel A on: 09/24/2017 04:21 PM   Modules accepted: Orders

## 2017-09-24 NOTE — Patient Instructions (Signed)

## 2017-09-24 NOTE — Progress Notes (Signed)
  G1P0 10879w5d Estimated Date of Delivery: 10/10/17  Blood pressure 120/80, pulse 97, weight 148 lb (67.1 kg), last menstrual period 01/15/2017.   BP weight and urine results all reviewed and noted.  Please refer to the obstetrical flow sheet for the fundal height and fetal heart rate documentation:  Patient reports good fetal movement, denies any bleeding and no rupture of membranes symptoms or regular contractions. Patient still c/o vaginal/urethreal pain "all the time"  Had been given a rx for percocet, requesting refill.  Discussed triggering cravings and how she shouldn't be taking narcotics except for very short periods of time.  Pt states that she agrees.  All questions were answered. On keflex suppression for stent,also on ditropan for spasms; chronic proteinuria.   No care 30-37 weeks. [place].  Vomited glucola, no care 30-37 weeks, doesn't plan on trying to repeat   Physical Assessment:   Vitals:   09/24/17 1531  BP: 120/80  Pulse: 97  Weight: 148 lb (67.1 kg)  Body mass index is 27.07 kg/m.        Physical Examination:   General appearance: Well appearing, and in no distress  Mental status: Alert, oriented to person, place, and time  Skin: Warm & dry  Cardiovascular: Normal heart rate noted  Respiratory: Normal respiratory effort, no distress  Abdomen: Soft, gravid, nontender  Pelvic: Cervical exam performed  Dilation: 1 Effacement (%): 20 Station: -2  Extremities: Edema: None  Fetal Status: Fetal Heart Rate (bpm): 146 Fundal Height: 36 cm Movement: Present Presentation: Vertex  Results for orders placed or performed in visit on 09/24/17 (from the past 24 hour(s))  POCT urinalysis dipstick   Collection Time: 09/24/17  3:37 PM  Result Value Ref Range   Color, UA     Clarity, UA     Glucose, UA neg    Bilirubin, UA     Ketones, UA neg    Spec Grav, UA  1.010 - 1.025   Blood, UA 3    pH, UA  5.0 - 8.0   Protein, UA 1    Urobilinogen, UA  0.2 or 1.0 E.U./dL   Nitrite, UA neg    Leukocytes, UA Negative Negative   Appearance     Odor       Orders Placed This Encounter  Procedures  . Strep Gp B NAA+Rflx  . GC/Chlamydia Probe Amp  . US OB Follow Up  . POCT urinalysis dipstick    Plan:  Continued routine obstetrical care, pt not planning on getting GTT. Huntley DecSara from NICU will call pt to schedule NAS tour.   Blood sugar 69 (after eating 2 cuties). Lidocaine jelly for urethral pain   Return in about 1 week (around 10/01/2017) for LROB, US:EFW.

## 2017-09-26 ENCOUNTER — Encounter (HOSPITAL_COMMUNITY): Payer: Self-pay

## 2017-09-26 LAB — GC/CHLAMYDIA PROBE AMP
CHLAMYDIA, DNA PROBE: NEGATIVE
Neisseria gonorrhoeae by PCR: NEGATIVE

## 2017-09-26 NOTE — Progress Notes (Signed)
Two voicemail messages and an e-mail sent to Ms. [name] to schedule NICU NAS tour. Will wait for patient to respond and attempt to schedule at her earliest convenience. Patient may contact [phone] to schedule.

## 2017-09-27 ENCOUNTER — Other Ambulatory Visit: Payer: Self-pay | Admitting: Obstetrics & Gynecology

## 2017-09-29 LAB — STREP GP B SUSCEPTIBILITY

## 2017-09-29 LAB — STREP GP B NAA+RFLX: Strep Gp B NAA+Rflx: POSITIVE — AB

## 2017-10-02 ENCOUNTER — Other Ambulatory Visit: Payer: Medicaid Other

## 2017-10-02 ENCOUNTER — Encounter: Payer: Medicaid Other | Admitting: Advanced Practice Midwife

## 2017-10-09 ENCOUNTER — Other Ambulatory Visit: Payer: Self-pay

## 2017-10-09 ENCOUNTER — Ambulatory Visit (INDEPENDENT_AMBULATORY_CARE_PROVIDER_SITE_OTHER): Payer: Medicaid Other | Admitting: Advanced Practice Midwife

## 2017-10-09 ENCOUNTER — Encounter: Payer: Self-pay | Admitting: Advanced Practice Midwife

## 2017-10-09 ENCOUNTER — Ambulatory Visit (INDEPENDENT_AMBULATORY_CARE_PROVIDER_SITE_OTHER): Payer: Medicaid Other

## 2017-10-09 ENCOUNTER — Other Ambulatory Visit: Payer: Self-pay | Admitting: Advanced Practice Midwife

## 2017-10-09 VITALS — BP 150/98 | HR 80 | Wt 150.0 lb

## 2017-10-09 DIAGNOSIS — Z3A39 39 weeks gestation of pregnancy: Secondary | ICD-10-CM

## 2017-10-09 DIAGNOSIS — Z1389 Encounter for screening for other disorder: Secondary | ICD-10-CM

## 2017-10-09 DIAGNOSIS — O0933 Supervision of pregnancy with insufficient antenatal care, third trimester: Secondary | ICD-10-CM

## 2017-10-09 DIAGNOSIS — Z331 Pregnant state, incidental: Secondary | ICD-10-CM

## 2017-10-09 DIAGNOSIS — F1120 Opioid dependence, uncomplicated: Secondary | ICD-10-CM

## 2017-10-09 DIAGNOSIS — Z3403 Encounter for supervision of normal first pregnancy, third trimester: Secondary | ICD-10-CM | POA: Diagnosis not present

## 2017-10-09 DIAGNOSIS — O365931 Maternal care for other known or suspected poor fetal growth, third trimester, fetus 1: Secondary | ICD-10-CM

## 2017-10-09 DIAGNOSIS — O99320 Drug use complicating pregnancy, unspecified trimester: Principal | ICD-10-CM

## 2017-10-09 DIAGNOSIS — F1991 Other psychoactive substance use, unspecified, in remission: Secondary | ICD-10-CM

## 2017-10-09 DIAGNOSIS — O365930 Maternal care for other known or suspected poor fetal growth, third trimester, not applicable or unspecified: Secondary | ICD-10-CM

## 2017-10-09 DIAGNOSIS — O99323 Drug use complicating pregnancy, third trimester: Secondary | ICD-10-CM | POA: Diagnosis not present

## 2017-10-09 DIAGNOSIS — Z87898 Personal history of other specified conditions: Secondary | ICD-10-CM

## 2017-10-09 LAB — POCT URINALYSIS DIPSTICK
Blood, UA: NEGATIVE
GLUCOSE UA: NEGATIVE
KETONES UA: NEGATIVE
Leukocytes, UA: NEGATIVE
Nitrite, UA: NEGATIVE
Protein, UA: NEGATIVE

## 2017-10-09 NOTE — Progress Notes (Addendum)
US 39+6 wks,cephalic,anterior pl gr 3,bilat adnexa's wnl,distended fetal bladder 7.1 x 4.3 x 4.3 cm ,normal kidneys,bilat hydroceles,fhr 124 bpm,EFW 3045 g 12%,BPD 6%,HC 4%,AC 5%,FL 7%,RI .62,.60=79%,BPP 8/8

## 2017-10-09 NOTE — Progress Notes (Signed)
  G1P0 2881w6d Estimated Date of Delivery: 10/10/17  Blood pressure (!) 150/98, pulse 80, weight 150 lb (68 kg), last menstrual period 01/15/2017.  150/100 BP weight and urine results all reviewed and noted.  Please refer to the obstetrical flow sheet for the fundal height and fetal heart rate documentation:  Patient reports good fetal movement, denies any bleeding and no rupture of membranes symptoms or regular contractions.   Physical Assessment:   Vitals:   10/09/17 1343  BP: (!) 150/98  Pulse: 80  Weight: 150 lb (68 kg)  Body mass index is 27.44 kg/m.        Physical Examination:   General appearance: Well appearing, and in no distress  Mental status: Alert, oriented to person, place, and time  Skin: Warm & dry  Cardiovascular: Normal heart rate noted  Respiratory: Normal respiratory effort, no distress  Abdomen: Soft, gravid, nontender  Pelvic: Cervical exam deferred         Extremities: Edema: None  Fetal Status:     Movement: Present    Results for orders placed or performed in visit on 10/09/17 (from the past 24 hour(s))  POCT urinalysis dipstick   Collection Time: 10/09/17  1:45 PM  Result Value Ref Range   Color, UA     Clarity, UA     Glucose, UA neg    Bilirubin, UA     Ketones, UA neg    Spec Grav, UA  1.010 - 1.025   Blood, UA neg    pH, UA  5.0 - 8.0   Protein, UA neg    Urobilinogen, UA  0.2 or 1.0 E.U./dL   Nitrite, UA neg    Leukocytes, UA Negative Negative   Appearance     Odor      US 39+6 wks,cephalic,anterior pl gr 3,bilat adnexa's wnl,distended fetal bladder 7.1 x 4.3 x 4.3 cm ,normal kidneys,bilat hydroceles,fhr 124 bpm,EFW 3045 g 12%,BPD 6%,HC 4%,AC 5%,FL 7%,RI .62,.60=79%,BPP 8/8   Orders Placed This Encounter  Procedures  . Pain Management Screening Profile (10S)  . POCT urinalysis dipstick   Plan: recommended IOL d/t size/elevated BPs.  Pt's mom is in hospital, her Letta Kocherapa just got admitted for a fall following a stroke and her sister  [place] stroke [place]  Is unable to come for IOL now, thinks she could come tomorrow sometime. Has a BP cuff at home.  Promises to take her BP and come for IOL ASAP if BPs >160/110.  Pt aware that BP can escalate quickly and abruption w/IUFD can occur suddenly and that recommendation is for IOL now.  Verbalized understanding but states that she "has to work some things out with the family today" and will come for IOL ASAP.   [facility]

## 2017-10-10 ENCOUNTER — Inpatient Hospital Stay (HOSPITAL_COMMUNITY): Payer: Medicaid Other | Admitting: Anesthesiology

## 2017-10-10 ENCOUNTER — Encounter (HOSPITAL_COMMUNITY): Payer: Self-pay | Admitting: *Deleted

## 2017-10-10 ENCOUNTER — Inpatient Hospital Stay (HOSPITAL_COMMUNITY)
Admission: AD | Admit: 2017-10-10 | Discharge: 2017-10-13 | DRG: 806 | Disposition: A | Discharge status: Home / Self Care | Payer: Medicaid Other | Source: Ambulatory Visit | Attending: Obstetrics & Gynecology | Admitting: Obstetrics & Gynecology

## 2017-10-10 DIAGNOSIS — F1110 Opioid abuse, uncomplicated: Secondary | ICD-10-CM | POA: Diagnosis present

## 2017-10-10 DIAGNOSIS — N200 Calculus of kidney: Secondary | ICD-10-CM | POA: Diagnosis present

## 2017-10-10 DIAGNOSIS — O133 Gestational [pregnancy-induced] hypertension without significant proteinuria, third trimester: Secondary | ICD-10-CM

## 2017-10-10 DIAGNOSIS — O99324 Drug use complicating childbirth: Secondary | ICD-10-CM | POA: Diagnosis present

## 2017-10-10 DIAGNOSIS — Z3A40 40 weeks gestation of pregnancy: Secondary | ICD-10-CM | POA: Diagnosis not present

## 2017-10-10 DIAGNOSIS — O134 Gestational [pregnancy-induced] hypertension without significant proteinuria, complicating childbirth: Secondary | ICD-10-CM | POA: Diagnosis present

## 2017-10-10 DIAGNOSIS — O99824 Streptococcus B carrier state complicating childbirth: Secondary | ICD-10-CM | POA: Diagnosis present

## 2017-10-10 DIAGNOSIS — O26833 Pregnancy related renal disease, third trimester: Secondary | ICD-10-CM | POA: Diagnosis present

## 2017-10-10 DIAGNOSIS — Z880 Allergy status to penicillin: Secondary | ICD-10-CM

## 2017-10-10 DIAGNOSIS — Z87891 Personal history of nicotine dependence: Secondary | ICD-10-CM | POA: Diagnosis not present

## 2017-10-10 DIAGNOSIS — Z3403 Encounter for supervision of normal first pregnancy, third trimester: Secondary | ICD-10-CM

## 2017-10-10 DIAGNOSIS — O139 Gestational [pregnancy-induced] hypertension without significant proteinuria, unspecified trimester: Secondary | ICD-10-CM | POA: Diagnosis present

## 2017-10-10 LAB — TYPE AND SCREEN
ABO/RH(D): A POS
Antibody Screen: NEGATIVE

## 2017-10-10 LAB — CBC
HCT: 39.9 % (ref 36.0–46.0)
Hemoglobin: 14.2 g/dL (ref 12.0–15.0)
MCH: 31.7 pg (ref 26.0–34.0)
MCHC: 35.6 g/dL (ref 30.0–36.0)
MCV: 89.1 fL (ref 78.0–100.0)
PLATELETS: 290 10*3/uL (ref 150–400)
RBC: 4.48 MIL/uL (ref 3.87–5.11)
RDW: 13.1 % (ref 11.5–15.5)
WBC: 17.3 10*3/uL — ABNORMAL HIGH (ref 4.0–10.5)

## 2017-10-10 LAB — PMP SCREEN PROFILE (10S), URINE
Amphetamine Scrn, Ur: NEGATIVE ng/mL
BARBITURATE SCREEN URINE: NEGATIVE ng/mL
BENZODIAZEPINE SCREEN, URINE: NEGATIVE ng/mL
CANNABINOIDS UR QL SCN: NEGATIVE ng/mL
COCAINE(METAB.)SCREEN, URINE: NEGATIVE ng/mL
Creatinine(Crt), U: 23.8 mg/dL (ref 20.0–300.0)
METHADONE SCREEN, URINE: NEGATIVE ng/mL
OPIATE SCREEN URINE: POSITIVE ng/mL — AB
OXYCODONE+OXYMORPHONE UR QL SCN: NEGATIVE ng/mL
Ph of Urine: 7.1 (ref 4.5–8.9)
Phencyclidine Qn, Ur: NEGATIVE ng/mL
Propoxyphene Scrn, Ur: NEGATIVE ng/mL

## 2017-10-10 LAB — PROTEIN / CREATININE RATIO, URINE
Creatinine, Urine: 106 mg/dL
PROTEIN CREATININE RATIO: 0.51 mg/mg{creat} — AB (ref 0.00–0.15)
TOTAL PROTEIN, URINE: 54 mg/dL

## 2017-10-10 LAB — COMPREHENSIVE METABOLIC PANEL
ALK PHOS: 162 U/L — AB (ref 38–126)
ALT: 10 U/L — AB (ref 14–54)
ANION GAP: 10 (ref 5–15)
AST: 17 U/L (ref 15–41)
Albumin: 2.9 g/dL — ABNORMAL LOW (ref 3.5–5.0)
BILIRUBIN TOTAL: 0.9 mg/dL (ref 0.3–1.2)
BUN: 7 mg/dL (ref 6–20)
CALCIUM: 8.6 mg/dL — AB (ref 8.9–10.3)
CO2: 17 mmol/L — AB (ref 22–32)
CREATININE: 0.49 mg/dL (ref 0.44–1.00)
Chloride: 107 mmol/L (ref 101–111)
GFR calc Af Amer: >60 mL/min (ref >60)
GFR calc non Af Amer: >60 mL/min (ref >60)
Glucose, Bld: 90 mg/dL (ref 65–99)
Potassium: 4.1 mmol/L (ref 3.5–5.1)
SODIUM: 134 mmol/L — AB (ref 135–145)
TOTAL PROTEIN: 6.4 g/dL — AB (ref 6.5–8.1)

## 2017-10-10 LAB — POCT FERN TEST: POCT FERN TEST: POSITIVE

## 2017-10-10 LAB — MED LIST OPTION NOT SELECTED

## 2017-10-10 LAB — ABO/RH: ABO/RH(D): A POS

## 2017-10-10 MED ORDER — LACTATED RINGERS IV SOLN
500.0000 mL | INTRAVENOUS | Status: DC | PRN
  Administered 2017-10-11: 250 mL via INTRAVENOUS

## 2017-10-10 MED ORDER — CLINDAMYCIN PHOSPHATE 900 MG/50ML IV SOLN
900.0000 mg | Freq: Three times a day (TID) | INTRAVENOUS | Status: DC
  Administered 2017-10-10: 900 mg via INTRAVENOUS
  Filled 2017-10-10 (×3): qty 50

## 2017-10-10 MED ORDER — OXYTOCIN 10 UNIT/ML IJ SOLN
10.0000 [IU] | Freq: Once | INTRAMUSCULAR | Status: DC | PRN
  Filled 2017-10-10: qty 1

## 2017-10-10 MED ORDER — LABETALOL HCL 5 MG/ML IV SOLN
20.0000 mg | INTRAVENOUS | Status: AC | PRN
  Administered 2017-10-10: 20 mg via INTRAVENOUS
  Administered 2017-10-10: 40 mg via INTRAVENOUS
  Administered 2017-10-10: 80 mg via INTRAVENOUS
  Filled 2017-10-10: qty 4
  Filled 2017-10-10: qty 16
  Filled 2017-10-10: qty 8

## 2017-10-10 MED ORDER — ACETAMINOPHEN 325 MG PO TABS: 650.0000 mg | ORAL_TABLET | ORAL | Status: DC | PRN

## 2017-10-10 MED ORDER — HYDRALAZINE HCL 20 MG/ML IJ SOLN: 10.0000 mg | Freq: Once | INTRAMUSCULAR | Status: DC | PRN

## 2017-10-10 MED ORDER — LACTATED RINGERS IV SOLN
500.0000 mL | Freq: Once | INTRAVENOUS | Status: AC
  Administered 2017-10-10: 500 mL via INTRAVENOUS

## 2017-10-10 MED ORDER — PHENYLEPHRINE 40 MCG/ML (10ML) SYRINGE FOR IV PUSH (FOR BLOOD PRESSURE SUPPORT)
80.0000 ug | PREFILLED_SYRINGE | INTRAVENOUS | Status: DC | PRN
Start: 2017-10-10 — End: 2017-10-11
  Administered 2017-10-10 (×2): 40 ug via INTRAVENOUS
  Filled 2017-10-10: qty 10
  Filled 2017-10-10: qty 5

## 2017-10-10 MED ORDER — FENTANYL 2.5 MCG/ML BUPIVACAINE 1/10 % EPIDURAL INFUSION (WH - ANES)
14.0000 mL/h | INTRAMUSCULAR | Status: DC | PRN
  Administered 2017-10-10: 12 mL/h via EPIDURAL
  Administered 2017-10-10: 14 mL/h via EPIDURAL
  Filled 2017-10-10 (×2): qty 100

## 2017-10-10 MED ORDER — EPHEDRINE 5 MG/ML INJ
10.0000 mg | INTRAVENOUS | Status: DC | PRN
  Filled 2017-10-10: qty 2

## 2017-10-10 MED ORDER — LIDOCAINE HCL (PF) 1 % IJ SOLN
INTRAMUSCULAR | Status: DC | PRN
  Administered 2017-10-10 (×2): 4 mL via EPIDURAL

## 2017-10-10 MED ORDER — OXYTOCIN 40 UNITS IN LACTATED RINGERS INFUSION - SIMPLE MED
1.0000 m[IU]/min | INTRAVENOUS | Status: DC
  Administered 2017-10-11: 999 m[IU]/min via INTRAVENOUS
  Filled 2017-10-10: qty 1000

## 2017-10-10 MED ORDER — ONDANSETRON HCL 4 MG/2ML IJ SOLN
4.0000 mg | Freq: Once | INTRAMUSCULAR | Status: AC
  Administered 2017-10-10: 4 mg via INTRAVENOUS
  Filled 2017-10-10: qty 2

## 2017-10-10 MED ORDER — SOD CITRATE-CITRIC ACID 500-334 MG/5ML PO SOLN: 30.0000 mL | ORAL | Status: DC | PRN

## 2017-10-10 MED ORDER — OXYTOCIN 40 UNITS IN LACTATED RINGERS INFUSION - SIMPLE MED: 2.5000 [IU]/h | INTRAVENOUS | Status: DC

## 2017-10-10 MED ORDER — TERBUTALINE SULFATE 1 MG/ML IJ SOLN
0.2500 mg | Freq: Once | INTRAMUSCULAR | Status: DC | PRN
  Filled 2017-10-10: qty 1

## 2017-10-10 MED ORDER — PHENYLEPHRINE 40 MCG/ML (10ML) SYRINGE FOR IV PUSH (FOR BLOOD PRESSURE SUPPORT)
80.0000 ug | PREFILLED_SYRINGE | INTRAVENOUS | Status: DC | PRN
  Administered 2017-10-10: 40 ug via INTRAVENOUS
  Filled 2017-10-10: qty 5

## 2017-10-10 MED ORDER — LIDOCAINE HCL (PF) 1 % IJ SOLN
30.0000 mL | INTRAMUSCULAR | Status: DC | PRN
  Filled 2017-10-10: qty 30

## 2017-10-10 MED ORDER — OXYTOCIN BOLUS FROM INFUSION: 500.0000 mL | Freq: Once | INTRAVENOUS | Status: DC

## 2017-10-10 MED ORDER — DIPHENHYDRAMINE HCL 50 MG/ML IJ SOLN: 12.5000 mg | INTRAMUSCULAR | Status: DC | PRN

## 2017-10-10 MED ORDER — LACTATED RINGERS IV SOLN
INTRAVENOUS | Status: DC
  Administered 2017-10-10: 125 mL/h via INTRAVENOUS
  Administered 2017-10-10: 19:00:00 via INTRAVENOUS

## 2017-10-10 NOTE — MAU Note (Signed)
Contractions started last night, getting more intense today. Pt. Feels like she has been leaking all day. EFM applied - FHR 120s, Toco applied - abd. Soft. Positive for fetal movement, no vaginal bleeding noted.

## 2017-10-10 NOTE — Anesthesia Procedure Notes (Signed)
Epidural Patient location during procedure: OB Start time: 10/10/2017 6:33 PM  Staffing Anesthesiologist: Mal AmabileFoster, Basilio Meadow, MD Performed: anesthesiologist   Preanesthetic Checklist Completed: patient identified, site marked, surgical consent, pre-op evaluation, timeout performed, IV checked, risks and benefits discussed and monitors and equipment checked  Epidural Patient position: sitting Prep: site prepped and draped and DuraPrep Patient monitoring: continuous pulse ox and blood pressure Approach: midline Location: L3-L4 Injection technique: LOR air  Needle:  Needle type: Tuohy  Needle gauge: 17 G Needle length: 9 cm and 9 Needle insertion depth: 4 cm Catheter type: closed end flexible Catheter size: 19 Gauge Catheter at skin depth: 9 cm Test dose: negative and Other  Assessment Events: blood not aspirated, injection not painful, no injection resistance, negative IV test and no paresthesia  Additional Notes Patient identified. Risks and benefits discussed including failed block, incomplete  Pain control, post dural puncture headache, nerve damage, paralysis, blood pressure Changes, nausea, vomiting, reactions to medications-both toxic and allergic and post Partum back pain. All questions were answered. Patient expressed understanding and wished to proceed. Sterile technique was used throughout procedure. Epidural site was Dressed with sterile barrier dressing. No paresthesias, signs of intravascular injection Or signs of intrathecal spread were encountered.  Patient was more comfortable after the epidural was dosed. Please see RN's note for documentation of vital signs and FHR which are stable.

## 2017-10-10 NOTE — Anesthesia Preprocedure Evaluation (Signed)
Anesthesia Evaluation  Patient identified by MRN, date of birth, ID band Patient awake    Reviewed: Allergy & Precautions, Patient's Chart, lab work & pertinent test results  Airway Mallampati: III  TM Distance: >3 FB Neck ROM: Full    Dental no notable dental hx. (+) Teeth Intact   Pulmonary former smoker,    Pulmonary exam normal breath sounds clear to auscultation       Cardiovascular hypertension, Normal cardiovascular exam Rhythm:Regular Rate:Normal     Neuro/Psych PSYCHIATRIC DISORDERS Anxiety Depression    GI/Hepatic Neg liver ROS, GERD  Medicated and Controlled,(+)     substance abuse  IV drug use,   Endo/Other  negative endocrine ROS  Renal/GU Renal diseaseHx/o Renal calculus  negative genitourinary   Musculoskeletal   Abdominal   Peds  Hematology  (+) anemia ,   Anesthesia Other Findings   Reproductive/Obstetrics                             Anesthesia Physical Anesthesia Plan  ASA: III  Anesthesia Plan: Epidural   Post-op Pain Management:    Induction:   PONV Risk Score and Plan:   Airway Management Planned: Natural Airway  Additional Equipment:   Intra-op Plan:   Post-operative Plan:   Informed Consent: I have reviewed the patients History and Physical, chart, labs and discussed the procedure including the risks, benefits and alternatives for the proposed anesthesia with the patient or authorized representative who has indicated his/her understanding and acceptance.     Plan Discussed with: Anesthesiologist  Anesthesia Plan Comments:         Anesthesia Quick Evaluation

## 2017-10-10 NOTE — Anesthesia Pain Management Evaluation Note (Signed)
  [facility] Pain Management Visit Note  Patient: [name], 32 y.o., female  "Hello I am a member of the anesthesia team at [facility]. We have an anesthesia team available at all times to provide care throughout the hospital, including epidural management and anesthesia for C-section. I don't know your plan for the delivery whether it a natural birth, water birth, IV sedation, nitrous supplementation, doula or epidural, but we want to meet your pain goals."   1.Was your pain managed to your expectations on prior hospitalizations?   No prior hospitalizations  2.What is your expectation for pain management during this hospitalization?     Epidural  3.How can we help you reach that goal? Pt has epidural placed recently and is slowly getting comfortable  Record the patient's initial score and the patient's pain goal.   Pain: 2  Pain Goal: 0 [facility] wants you to be able to say your pain was always managed very well.  [name] 10/10/2017

## 2017-10-10 NOTE — MAU Note (Signed)
Late entry: HTN protocol not initiated due to pt.'s desire to have epidural. Provider notified of increased BP, instructed to take another BP.  Pt. Crying in pain while RN start IV and drawing admission labs. Repeat BP check performed while attempting to get pt. To L&D for epidural placement. Pt. Denied headache or epigastric pain. Report given to L&D charge.

## 2017-10-10 NOTE — Progress Notes (Signed)
[name] is a 32 y.o. G1P0 at 5471w0d by 12 week ultrasound admitted [facility]  Subjective: Pt comfortable with epidural. S/O at bedside for support.  Objective: BP (!) 103/47   Pulse 62   Temp 98 F (36.7 C) (Oral)   Resp 20   Ht 5\' 3"  (1.6 m)   Wt 146 lb 7 oz (66.4 kg)   LMP 01/15/2017   SpO2 100%   BMI 25.94 kg/m  No intake/output data recorded. No intake/output data recorded.  FHT:  FHR: 120 bpm, variability: moderate,  accelerations:  Present,  decelerations:  Present Prior to cervical exam at 2141, Pt with Category I FHR tracing with accels.  Within 10 minutes of exam, FHR down to 90s for prolonged decel x 2, resolved with position change/IV bolus UC:   regular, every 2 minutes SVE:   Dilation: 7 Effacement (%): 100 Station: 0 Exam by:: L. Leftwich AROM of forebag, pt tolerated well  Labs: Lab Results  Component Value Date   WBC 17.3 (H) 10/10/2017   HGB 14.2 10/10/2017   HCT 39.9 10/10/2017   MCV 89.1 10/10/2017   PLT 290 10/10/2017    Assessment / Plan: Induction of labor due to GHTN,  progressing well on pitocin  Labor: Progressing normally Preeclampsia:  labs stable Fetal Wellbeing:  Category II Pain Control:  Epidural I/D:  GBS neg Anticipated MOD:  NSVD  Sharen CounterLisa Leftwich-Kirby 10/10/2017, 10:59 PM

## 2017-10-11 ENCOUNTER — Encounter (HOSPITAL_COMMUNITY): Payer: Self-pay

## 2017-10-11 ENCOUNTER — Other Ambulatory Visit: Payer: Self-pay

## 2017-10-11 DIAGNOSIS — O134 Gestational [pregnancy-induced] hypertension without significant proteinuria, complicating childbirth: Secondary | ICD-10-CM

## 2017-10-11 DIAGNOSIS — Z3A40 40 weeks gestation of pregnancy: Secondary | ICD-10-CM

## 2017-10-11 DIAGNOSIS — O99824 Streptococcus B carrier state complicating childbirth: Secondary | ICD-10-CM

## 2017-10-11 LAB — CBC
HCT: 34.8 % — ABNORMAL LOW (ref 36.0–46.0)
HEMATOCRIT: 36 % (ref 36.0–46.0)
HEMOGLOBIN: 12.9 g/dL (ref 12.0–15.0)
Hemoglobin: 12.2 g/dL (ref 12.0–15.0)
MCH: 31.9 pg (ref 26.0–34.0)
MCH: 31.4 pg (ref 26.0–34.0)
MCHC: 35.8 g/dL (ref 30.0–36.0)
MCHC: 35.1 g/dL (ref 30.0–36.0)
MCV: 89.1 fL (ref 78.0–100.0)
MCV: 89.7 fL (ref 78.0–100.0)
PLATELETS: 219 10*3/uL (ref 150–400)
Platelets: 220 10*3/uL (ref 150–400)
RBC: 4.04 MIL/uL (ref 3.87–5.11)
RBC: 3.88 MIL/uL (ref 3.87–5.11)
RDW: 13.2 % (ref 11.5–15.5)
RDW: 13.3 % (ref 11.5–15.5)
WBC: 22.3 10*3/uL — ABNORMAL HIGH (ref 4.0–10.5)
WBC: 22.6 10*3/uL — AB (ref 4.0–10.5)

## 2017-10-11 LAB — RPR: RPR Ser Ql: NONREACTIVE

## 2017-10-11 MED ORDER — DIBUCAINE 1 % RE OINT: 1.0000 "application " | TOPICAL_OINTMENT | RECTAL | Status: DC | PRN

## 2017-10-11 MED ORDER — IBUPROFEN 600 MG PO TABS
600.0000 mg | ORAL_TABLET | Freq: Four times a day (QID) | ORAL | Status: DC
  Administered 2017-10-11 – 2017-10-13 (×10): 600 mg via ORAL
  Filled 2017-10-11 (×10): qty 1

## 2017-10-11 MED ORDER — ZOLPIDEM TARTRATE 5 MG PO TABS
5.0000 mg | ORAL_TABLET | Freq: Every evening | ORAL | Status: DC | PRN
  Administered 2017-10-11 – 2017-10-12 (×2): 5 mg via ORAL
  Filled 2017-10-11 (×2): qty 1

## 2017-10-11 MED ORDER — DIPHENHYDRAMINE HCL 25 MG PO CAPS: 25.0000 mg | ORAL_CAPSULE | Freq: Four times a day (QID) | ORAL | Status: DC | PRN

## 2017-10-11 MED ORDER — ONDANSETRON HCL 4 MG/2ML IJ SOLN: 4.0000 mg | INTRAMUSCULAR | Status: DC | PRN

## 2017-10-11 MED ORDER — METHYLERGONOVINE MALEATE 0.2 MG PO TABS: 0.2000 mg | ORAL_TABLET | ORAL | Status: DC | PRN

## 2017-10-11 MED ORDER — PRENATAL MULTIVITAMIN CH
1.0000 | ORAL_TABLET | Freq: Every day | ORAL | Status: DC
  Administered 2017-10-11 – 2017-10-13 (×3): 1 via ORAL
  Filled 2017-10-11 (×3): qty 1

## 2017-10-11 MED ORDER — METHYLERGONOVINE MALEATE 0.2 MG/ML IJ SOLN: 0.2000 mg | INTRAMUSCULAR | Status: DC | PRN

## 2017-10-11 MED ORDER — COCONUT OIL OIL: 1.0000 "application " | TOPICAL_OIL | Status: DC | PRN

## 2017-10-11 MED ORDER — ACETAMINOPHEN 325 MG PO TABS
650.0000 mg | ORAL_TABLET | ORAL | Status: DC | PRN
  Administered 2017-10-11 – 2017-10-12 (×5): 650 mg via ORAL
  Filled 2017-10-11 (×5): qty 2

## 2017-10-11 MED ORDER — ONDANSETRON HCL 4 MG PO TABS: 4.0000 mg | ORAL_TABLET | ORAL | Status: DC | PRN

## 2017-10-11 MED ORDER — SIMETHICONE 80 MG PO CHEW: 80.0000 mg | CHEWABLE_TABLET | ORAL | Status: DC | PRN

## 2017-10-11 MED ORDER — TETANUS-DIPHTH-ACELL PERTUSSIS 5-2.5-18.5 LF-MCG/0.5 IM SUSP: 0.5000 mL | Freq: Once | INTRAMUSCULAR | Status: DC

## 2017-10-11 MED ORDER — WITCH HAZEL-GLYCERIN EX PADS: 1.0000 "application " | MEDICATED_PAD | CUTANEOUS | Status: DC | PRN

## 2017-10-11 MED ORDER — BUPRENORPHINE HCL 8 MG SL SUBL
8.0000 mg | SUBLINGUAL_TABLET | Freq: Every day | SUBLINGUAL | Status: DC
  Administered 2017-10-11 – 2017-10-12 (×2): 8 mg via SUBLINGUAL
  Filled 2017-10-11 (×2): qty 1

## 2017-10-11 MED ORDER — SENNOSIDES-DOCUSATE SODIUM 8.6-50 MG PO TABS
2.0000 | ORAL_TABLET | ORAL | Status: DC
  Administered 2017-10-11 – 2017-10-12 (×2): 2 via ORAL
  Filled 2017-10-11 (×2): qty 2

## 2017-10-11 MED ORDER — BENZOCAINE-MENTHOL 20-0.5 % EX AERO: 1.0000 "application " | INHALATION_SPRAY | CUTANEOUS | Status: DC | PRN

## 2017-10-11 NOTE — H&P (Addendum)
[name] is a 32 y.o. female admitted for SROM and active labor and gHTN.  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 She was 4/90/-2 on arrival and had SROM in MAU.  BPs severe range on admission, but pt reporting significant anxiety. Preeclampsia protocol initiated and IV labetalol given, pt normotensive after meds.  Preeclampsia labwork ordered.    [facility]  Initiated Care at  14wks  FOB [name]  Dating By 1st trimester u/s 12wks  Pap 04/17/17: neg w/ - HRHPV  GC/CT Initial:  -/-             36+wks:-/-  Genetic Screen NT/IT: neg  CF screen neg  Anatomic [place] Female w/ isolated EICF '[name]' 10/09/17 bil hydroceles and distended bladder w/nl kidneys  Flu vaccine 04/17/17  Tdap Recommended ~ 28wks  Glucose Screen  2 hr  GBS neg  Feed Preference BOTH  Contraception IUD  Circumcision outpt  Childbirth Classes   Pediatrician Can't remember    OB History    Gravida Para Term Preterm AB Living   1             SAB TAB Ectopic Multiple Live Births           0     Past Medical History:  Diagnosis Date  . Allergy   . Anxiety   . Chronic kidney disease    kidney stones; stent in left kidney  . Depression   . History of kidney stones 2010   2018-  . Insomnia    Past Surgical History:  Procedure Laterality Date  . BREAST SURGERY     augmentation  . CYSTOSCOPY W/ URETERAL STENT PLACEMENT  11/28/2016   Procedure: CYSTOSCOPY WITH LEFT RETROGRADE PYELOGRAM/LEFT URETERAL STENT PLACEMENT;  Surgeon: Marcine Matar, MD;  Location: [facility];  Service: Urology;;  . EYE SURGERY     lasix  . WISDOM TOOTH EXTRACTION     Family History: family history includes ADD / ADHD in her brother and sister; Depression in her mother and sister; Hyperlipidemia in her father; Hypertension in her brother, father, maternal grandmother, mother, and sister; Kidney failure in her paternal grandmother; Stroke in her maternal  grandmother and sister. Social History:  reports that she has quit smoking. She has a 1.50 pack-year smoking history. she has never used smokeless tobacco. She reports that she does not drink alcohol or use drugs.     Maternal Diabetes: No Genetic Screening: Normal Maternal Ultrasounds/Referrals: Normal Fetal Ultrasounds or other Referrals:  None Maternal Substance Abuse:  Yes:  Type: Other:  Significant Maternal Medications:  None Significant Maternal Lab Results:  Lab values include: Group B Strep positive Other Comments:  On Subutex 8 mg daily, Clindamycin for GBS positive  Review of Systems  Constitutional: Negative for chills, fever and malaise/fatigue.  Eyes: Negative for blurred vision.  Respiratory: Negative for cough and shortness of breath.   Cardiovascular: Negative for chest pain.  Gastrointestinal: Negative for heartburn and vomiting.  Genitourinary: Negative for dysuria, frequency and urgency.  Musculoskeletal: Negative.   Neurological: Negative for dizziness and headaches.  Psychiatric/Behavioral: Negative for depression. The patient is nervous/anxious.    Maternal Medical History:  Reason for admission: gHTN  Contractions: Frequency: rare.   Perceived severity is mild.    Fetal activity: Perceived fetal activity is normal.   Last perceived fetal movement was within the past hour.    Prenatal complications: Substance abuse.  Renal calculi with stent   Prenatal Complications - Diabetes: none.    Dilation: 10 Effacement (%): 100 Station: Plus 2 Exam by:: L. Leftwich Blood pressure (!) 146/75, pulse 77, temperature 99.3 F (37.4 C), resp. rate 20, height 5\' 3"  (1.6 m), weight 146 lb 7 oz (66.4 kg), last menstrual period 01/15/2017, SpO2 100 %.   Maternal Exam:  Uterine Assessment: Contraction strength is mild.  Contraction frequency is irregular.   Abdomen: Fetal presentation: vertex     Fetal Exam Fetal Monitor Review: Mode: ultrasound.    Baseline rate: 120.  Variability: moderate (6-25 bpm).   Pattern: accelerations present and no decelerations.    Fetal State Assessment: Category I - tracings are normal.     Physical Exam  Nursing note and vitals reviewed. Constitutional: She is oriented to person, place, and time. She appears well-developed and well-nourished.  Neck: Normal range of motion.  Cardiovascular: Normal rate and regular rhythm.  Respiratory: Effort normal and breath sounds normal.  GI: Soft.  Musculoskeletal: Normal range of motion.  Neurological: She is alert and oriented to person, place, and time. She has normal reflexes.  Skin: Skin is warm and dry.  Psychiatric: She has a normal mood and affect. Her behavior is normal. Judgment and thought content normal.    Prenatal labs: ABO, Rh: --/--/A POS, A POS Performed at [facility], [address] [place], [place] [address]  [phone](03/15 1735) Antibody: NEG (03/15 1735) Rubella: 1.57 (09/20 1607) RPR: Non Reactive (09/20 1607)  HBsAg: Negative (09/20 1607)  HIV: Non Reactive (09/20 1607)  GBS: Positive (02/27 0000)   Assessment/Plan: G1P0 @[redacted]w[redacted]d  with active labor and gHTN GBS positive, sensitive to Clindamycin  Admit to BS Clindamycin for GBS prophylaxis Expectant management of labor, consider augmentation PRN Preeclampsia labs pending    Sharen CounterLisa Leftwich-Kirby 10/11/2017, 2:09 AM

## 2017-10-11 NOTE — Progress Notes (Signed)
Pt requested something for sleep. Education provided on being alert to care for baby and on her pumping schedule. Mother insisted as this is her chance to sleep while FOB is available to help over night. Offered Benadryl and offered to call for melatonin, however MOB asked for ambien. MOB states she took [place] before pregnancy. Received verbal confirmation from FOB that he would for sure be here all night to take care of baby.

## 2017-10-11 NOTE — Progress Notes (Signed)
The following breastfeeding education and support was given to mom and dad: 1. Putting baby skin to skin for more time than baby is swaddled;   2. Latching baby to breast using correct positioning (baby & mom) - however mom has decided to only bottle feed and pump; 3. Hand expression at the following times: 0800; 4. Assisting baby to cue to bottlefeed 8 or more times in 24 hours; 5. Intake, output, and weight change (recorded in flowsheets); 6. Education on breast milk substitutes included: 1. Lead to engorgement; 2. Lower your confidence in your ability to breastfeed; 3. Decrease your milk supply; 4. Increase risk of baby developing asthma and allergies.

## 2017-10-11 NOTE — Anesthesia Postprocedure Evaluation (Signed)
Anesthesia Post Note  Patient: [name]  Procedure(s) Performed: AN AD HOC LABOR EPIDURAL     Patient location during evaluation: Mother Baby Anesthesia Type: Epidural Level of consciousness: awake and alert Pain management: pain level controlled Vital Signs Assessment: post-procedure vital signs reviewed and stable Respiratory status: spontaneous breathing, nonlabored ventilation and respiratory function stable Cardiovascular status: stable Postop Assessment: no headache, no backache and epidural receding Anesthetic complications: no    Last Vitals:  Vitals:   10/11/17 0405 10/11/17 0800  BP: 134/69 133/78  Pulse: 65 69  Resp: 18 16  Temp: 36.8 C 37.1 C  SpO2:      Last Pain:  Vitals:   10/11/17 0800  TempSrc: Oral  PainSc:    Pain Goal:                 Trellis PaganiniBREWER,Timera Windt N

## 2017-10-12 MED ORDER — AMLODIPINE BESYLATE 5 MG PO TABS
5.0000 mg | ORAL_TABLET | Freq: Every day | ORAL | Status: DC
  Administered 2017-10-12: 5 mg via ORAL
  Filled 2017-10-12 (×2): qty 1

## 2017-10-12 MED ORDER — AMLODIPINE BESYLATE 5 MG PO TABS
5.0000 mg | ORAL_TABLET | Freq: Every day | ORAL | Status: DC
  Administered 2017-10-13: 5 mg via ORAL
  Filled 2017-10-12 (×2): qty 1

## 2017-10-12 MED ORDER — IBUPROFEN 600 MG PO TABS: 600.0000 mg | ORAL_TABLET | Freq: Four times a day (QID) | ORAL | 0 refills | Status: DC

## 2017-10-12 NOTE — Clinical Social Work Maternal (Addendum)
CLINICAL SOCIAL WORK MATERNAL/CHILD NOTE  Patient Details  Name: [name] MRN: [id number] Date of Birth: 04/22/1986  Date:  10/12/2017  Clinical Social Worker Initiating Note:  Quasim Doyon, MSW, LCSW-A Date/Time: Initiated:  10/12/17/1357     Child's Name:  Soloman Sutton   Biological Parents:  Father, Mother   Need for Interpreter:  None   Reason for Referral:  Current Substance Use/Substance Use During Pregnancy    Address:  [address] [place] [address]    Phone number:  [phone] (home)     Additional phone number:  Household Members/Support Persons (HM/SP):   Household Member/Support Person 1   HM/SP Name Relationship DOB or Age  HM/SP -1 Billy Michael Sutton Spouse 07/02/1978  HM/SP -2        HM/SP -3        HM/SP -4        HM/SP -5        HM/SP -6        HM/SP -7        HM/SP -8          Natural Supports (not living in the home):  Immediate Family, Extended Family   Professional Supports: None   Employment: Part-time   Type of Work: CNA, [employer]  Education:  College graduate   Homebound arranged:  N/A  Financial Resources:  Medicaid   Other Resources:  WIC, Food Stamps    Cultural/Religious Considerations Which May Impact Care:  None  Strengths:  Ability to meet basic needs , Home prepared for child , Pediatrician chosen   Psychotropic Medications:         Pediatrician:    [place] area  Pediatrician List:   [facility]  [place]    [place]    [place]    [place]    [place]      Pediatrician Fax Number:    Risk Factors/Current Problems:  Substance Use (Patient on subutex)   Cognitive State:  Able to Concentrate , Alert , Insightful    Mood/Affect:  Calm , Interested    CSW Assessment: CSW met with patient and father of baby at bedside to discuss positive UDS for opiates. CSW obtained permission from patient to speak  about any topic regarding herself and infant in the presence of Billy. This is the patient's first child and her EDPS score was a zero. Patient has a Bachelor's degree and was working part time as a CNA during pregnancy. Patient stated that the newborn will sleep alone in his crib, SIDS reduction education was discussed, parents stated understanding. Patient states that she has a brand new car seat for transportation. Patient receives WIC and Food Stamps. Patient report having support from both sides of their family. The couple were residing in [place] with patient's mother to be close to the hospital prior to delivery but stated they intend to return to their apartment in [place]. CSW and patient discussed urine drug screen that was positive for opiates. Patient informed CSW that she has a stent in her left kidney that she was about to have surgery for whenever she found out she was pregnant. Patient stated that she has a kidney stone and it could not be removed while she was pregnant and began to give her severe pains during pregnancy as infant grew larger. Patient reported visiting [facility] in February for the severe pains, at discharge she received   a prescription for Percocet for pain management due to the stent being inoperable at the time. Per patient's record, she was at [facility] on 08/28/17. Patient stated that she was receiving medication asissted treatment (MAT) on Subutex and weekly [facility] at [facility] under the care of Dr. Margaret Bowen. Patient stated that she saw Dr. Bowen for the last time approximately a week before visiting [facility] and that she has not seen Dr. Bowen since that visit. Patient began taking the Percocet for pain and stopped taking her subutex. Per chart, patient got a refill of Percocet on 09/29/17. CSW inquired about drug of choice that led patient to being in MAT, patient stated that it was "prescription pain pills." CSW educated  patient's on legal obligation of CSW to notify [place] DSS of infant being born to a mother on Subutex while pregnant and of the hospital's drug screening policy, parents stated understanding.   CSW spoke with Dr. Bates to inform her of patient receiving subutex while inpatient for delivery but was not taking it prior to admission. CSW to continue following until discharge is complete.  CSW Plan/Description:  Psychosocial Support and Ongoing Assessment of Needs, Sudden Infant Death Syndrome (SIDS) Education, Perinatal Mood and Anxiety Disorder (PMADs) Education, Neonatal Abstinence Syndrome (NAS) Education, CSW Will Continue to Monitor Umbilical Cord Tissue Drug Screen Results and Make Report if Warranted    Tanish Sinkler L Arkel Cartwright, LCSWA 10/12/2017, 2:17 PM 

## 2017-10-12 NOTE — Progress Notes (Addendum)
POSTPARTUM PROGRESS NOTE  Post Partum Day 1 Subjective:  Saverio DankerSara E Hamlett is a 32 y.o. G1P1001 2350w1d s/p VAVD with gHTN.  No acute events overnight.  Pt denies problems with ambulating, voiding or po intake. Pain is moderately controlled. Lochia Minimal.   Objective: Blood pressure 140/78, pulse 65, temperature 98.1 F (36.7 C), temperature source Oral, resp. rate 18, height 5\' 3"  (1.6 m), weight 66.4 kg (146 lb 7 oz), last menstrual period 01/15/2017, SpO2 99 %, unknown if currently breastfeeding.  Physical Exam:  General: alert, cooperative and no distress Lochia:normal flow Chest: no respiratory distress Heart:regular rate, distal pulses intact Abdomen: soft, nontender,  Uterine Fundus: firm, appropriately tender DVT Evaluation: No calf swelling or tenderness Extremities: no edema  Recent Labs    10/11/17 0127 10/11/17 0645  HGB 12.9 12.2  HCT 36.0 34.8*    Assessment/Plan:  ASSESSMENT: Saverio DankerSara E Killingsworth is a 32 y.o. G1P1001 8750w1d s/p VAVD with gHTN. She had severe range BPs on admission and hypotension during labor. Hx also significant for subutex use and renal calculi with stents. Her BP has been in the 140s/70s.  Plan for discharge tomorrow Start Norvasc 10mg  MOF: breast MOC: IUD    LOS: 2 days   Lillette BoxerStephanie R Corder 10/12/2017, 7:42 AM   OB FELLOW MEDICAL STUDENT NOTE ATTESTATION I confirm that I have verified the information documented in the medical student's note and that I have also personally performed the physical exam and all medical decision making activities.  PPD#1, doing well other than some cramping.  BP remain elevated - will start Amlodipine 5 mg daily SW consult Plan to d/c tomorrow   Frederik PearJulie P Degele, MD OB Fellow 10/12/2017, 8:47 AM

## 2017-10-12 NOTE — Discharge Instructions (Signed)

## 2017-10-12 NOTE — Progress Notes (Signed)
Pt request ambien to help sleep. States it helped her the previous night. FOB at bedside and will be there throughout the night to take care of the baby. Mom strictly bottle feeding.

## 2017-10-12 NOTE — Discharge Summary (Signed)
OB Discharge Summary     Patient Name: [name] DOB: 02-01-1986 MRN: [id number] Date of admission: 10/10/2017  Delivering MD: Lazaro Arms )  Date of discharge: 10/13/2017    Admitting diagnosis: SROM, active labor, gHTN Intrauterine pregnancy: [redacted]w[redacted]d    Secondary diagnosis:  Active Problems:   Patient Active Problem List   Diagnosis Date Noted  . Gestational hypertension 10/10/2017  . Depression with anxiety 04/18/2017  . Supervision of normal first pregnancy 04/17/2017  . Pregnancy complicated by subutex maintenance, antepartum (HCC) 04/17/2017  . Kidney stones w/ Lt ureteral stent 03/24/2017  . History of heroin abuse 03/24/2017    Additional problems: none     Discharge diagnosis: Term Pregnancy Delivered                                                                                                Post partum procedures:none  Complications: None  Hospital course:  Onset of Labor With Vaginal Delivery     32 y.o. yo G1P1001 at [redacted]w[redacted]d was admitted in Active Labor on 10/10/2017. Patient had an uncomplicated labor course as follows:  Membrane Rupture Time/Date: 6:00 PM ,10/09/2017   Intrapartum Procedures: Episiotomy: None [1]                                         Lacerations:  2nd degree [3];Perineal [11]  Patient had a delivery of a Viable infant. 10/11/2017  Information for the patient's newborn:  Heidi, Mora [[id number]]  Delivery Method: Vaginal, Vacuum (Extractor)(Filed from Delivery Summary)    Pateint had an uncomplicated postpartum course.  Social work found out that patient has not been taking subutex since January and has been using percocet from ED prescription since that time. Her blood pressure was elevated on PPD#1, so started norvasc. Will need 1 week blood pressure check>ordered. She is ambulating, tolerating a regular diet, passing flatus, and urinating well. Patient is discharged home in stable condition on 10/13/17.   Physical exam  Vitals:   10/12/17 1835 10/13/17 0609  BP: (!) 147/90 (!) 142/85  Pulse: 70 64  Resp: 19 20  Temp: 97.7 F (36.5 C) 97.8 F (36.6 C)  SpO2: 97%     General: alert, cooperative and no distress Lochia: appropriate Uterine Fundus: firm Incision: N/A DVT Evaluation: No evidence of DVT seen on physical exam.  Labs: No results found for this or any previous visit (from the past 24 hour(s)).   Discharge instruction: per After Visit Summary and "Baby and Me Booklet".  After visit meds:  Allergies  Allergen Reactions  . Amoxicillin Rash    Has patient had a PCN reaction causing immediate rash, facial/tongue/throat swelling, SOB or lightheadedness with hypotension: Yes Has patient had a PCN reaction causing severe rash involving mucus membranes or skin necrosis: No Has patient had a PCN reaction that required hospitalization: No Has patient had a PCN reaction occurring within the last 10 years: No If all of the above answers are "NO", then may proceed with  Cephalosporin use.   Marland Kitchen. Penicillins Rash    Has patient had a PCN reaction causing immediate rash, facial/tongue/throat swelling, SOB or lightheadedness with hypotension: Yes Has patient had a PCN reaction causing severe rash involving mucus membranes or skin necrosis: No Has patient had a PCN reaction that required hospitalization: No Has patient had a PCN reaction occurring within the last 10 years: No If all of the above answers are "NO", then may proceed with Cephalosporin use.   . Sulfa Antibiotics Rash    Allergies as of 10/13/2017      Reactions   Amoxicillin Rash   Has patient had a PCN reaction causing immediate rash, facial/tongue/throat swelling, SOB or lightheadedness with hypotension: Yes Has patient had a PCN reaction causing severe rash involving mucus membranes or skin necrosis: No Has patient had a PCN reaction that required hospitalization: No Has patient had a PCN reaction occurring within the last 10 years: No If all  of the above answers are "NO", then may proceed with Cephalosporin use.   Penicillins Rash   Has patient had a PCN reaction causing immediate rash, facial/tongue/throat swelling, SOB or lightheadedness with hypotension: Yes Has patient had a PCN reaction causing severe rash involving mucus membranes or skin necrosis: No Has patient had a PCN reaction that required hospitalization: No Has patient had a PCN reaction occurring within the last 10 years: No If all of the above answers are "NO", then may proceed with Cephalosporin use.   Sulfa Antibiotics Rash      Medication List    STOP taking these medications   buprenorphine 8 MG Subl SL tablet Commonly known as:  SUBUTEX   omeprazole 20 MG capsule Commonly known as:  PRILOSEC   oxybutynin 5 MG tablet Commonly known as:  DITROPAN     TAKE these medications   acetaminophen 500 MG tablet Commonly known as:  TYLENOL Take 1,000 mg by mouth every 6 (six) hours as needed for mild pain or headache.   amLODipine 5 MG tablet Commonly known as:  NORVASC Take 1 tablet (5 mg total) by mouth daily.   ibuprofen 600 MG tablet Commonly known as:  ADVIL,MOTRIN Take 1 tablet (600 mg total) by mouth every 6 (six) hours.   prenatal vitamin w/FE, FA 27-1 MG Tabs tablet Take 1 tablet by mouth daily at 12 noon.        Diet: routine diet  Activity: Advance as tolerated. Pelvic rest for 6 weeks.   Outpatient follow up:1 week blood pressure check Future Appointments: No future appointments.  Follow up Appt: No Follow-up on file.     Postpartum contraception: IUD  Newborn Data: APGAR (1 MIN): 8   APGAR (5 MINS): 9     Baby Feeding: Bottle Disposition:NICU  [employer] Corporationmber Heidi Kuehnle, DO  10/13/2017

## 2017-10-12 NOTE — Progress Notes (Signed)
[facility] and [facility] to inform them of the patient's prescription inconsistencies (Subutex versus Percocet usage). Pediatrician communicated to CSW that mother and father were informed of five day stay for observation of withdrawal symptoms.  [name], MSW, LCSW-A Clinical Social Worker [facility] [phone]

## 2017-10-13 ENCOUNTER — Telehealth: Payer: Self-pay | Admitting: *Deleted

## 2017-10-13 MED ORDER — AMLODIPINE BESYLATE 5 MG PO TABS: 5.0000 mg | ORAL_TABLET | Freq: Every day | ORAL | 1 refills | Status: DC

## 2017-10-13 NOTE — Telephone Encounter (Signed)
Called pt to set up pp appointment but patient [place] that [place].  10-13-17  AS

## 2017-12-23 ENCOUNTER — Other Ambulatory Visit: Payer: Self-pay | Admitting: Urology

## 2017-12-23 ENCOUNTER — Encounter (HOSPITAL_BASED_OUTPATIENT_CLINIC_OR_DEPARTMENT_OTHER): Payer: Self-pay | Admitting: *Deleted

## 2017-12-23 NOTE — Progress Notes (Signed)
SPOKE W/ PT VIA PHONE FOR PRE-OP INTERVIEW.  NPO AFTER MN W/ EXCEPTION CLEAR LIQUIDS UNTIL 0715 (NO CREAM/MILK PRODUCTS).  ARRIVE AT 1115.  NEEDS ISTAT, URINE PREG., AND EKG.  WILL TAKE NORVASC AND OXYBUTYNIN AM DOS W/ SIPS OF WATER AND IF NEEDED TAKE TYLENOL.

## 2017-12-25 ENCOUNTER — Ambulatory Visit (HOSPITAL_BASED_OUTPATIENT_CLINIC_OR_DEPARTMENT_OTHER): Payer: Medicaid Other | Admitting: Anesthesiology

## 2017-12-25 ENCOUNTER — Encounter (HOSPITAL_BASED_OUTPATIENT_CLINIC_OR_DEPARTMENT_OTHER): Payer: Self-pay | Admitting: *Deleted

## 2017-12-25 ENCOUNTER — Encounter (HOSPITAL_BASED_OUTPATIENT_CLINIC_OR_DEPARTMENT_OTHER)
Admission: RE | Disposition: A | Discharge status: Home / Self Care | Payer: Self-pay | Source: Ambulatory Visit | Attending: Urology

## 2017-12-25 ENCOUNTER — Ambulatory Visit (HOSPITAL_BASED_OUTPATIENT_CLINIC_OR_DEPARTMENT_OTHER)
Admission: RE | Admit: 2017-12-25 | Discharge: 2017-12-25 | Disposition: A | Discharge status: Home / Self Care | Payer: Medicaid Other | Source: Ambulatory Visit | Attending: Urology | Admitting: Urology

## 2017-12-25 ENCOUNTER — Other Ambulatory Visit: Payer: Self-pay

## 2017-12-25 DIAGNOSIS — Y738 Miscellaneous gastroenterology and urology devices associated with adverse incidents, not elsewhere classified: Secondary | ICD-10-CM | POA: Diagnosis not present

## 2017-12-25 DIAGNOSIS — G4700 Insomnia, unspecified: Secondary | ICD-10-CM | POA: Insufficient documentation

## 2017-12-25 DIAGNOSIS — Z79899 Other long term (current) drug therapy: Secondary | ICD-10-CM | POA: Diagnosis not present

## 2017-12-25 DIAGNOSIS — Z87442 Personal history of urinary calculi: Secondary | ICD-10-CM | POA: Diagnosis not present

## 2017-12-25 DIAGNOSIS — F419 Anxiety disorder, unspecified: Secondary | ICD-10-CM | POA: Insufficient documentation

## 2017-12-25 DIAGNOSIS — F329 Major depressive disorder, single episode, unspecified: Secondary | ICD-10-CM | POA: Insufficient documentation

## 2017-12-25 DIAGNOSIS — Z882 Allergy status to sulfonamides status: Secondary | ICD-10-CM | POA: Diagnosis not present

## 2017-12-25 DIAGNOSIS — I10 Essential (primary) hypertension: Secondary | ICD-10-CM | POA: Diagnosis not present

## 2017-12-25 DIAGNOSIS — Z880 Allergy status to penicillin: Secondary | ICD-10-CM | POA: Insufficient documentation

## 2017-12-25 DIAGNOSIS — Y9289 Other specified places as the place of occurrence of the external cause: Secondary | ICD-10-CM | POA: Diagnosis not present

## 2017-12-25 DIAGNOSIS — F17210 Nicotine dependence, cigarettes, uncomplicated: Secondary | ICD-10-CM | POA: Insufficient documentation

## 2017-12-25 DIAGNOSIS — N201 Calculus of ureter: Secondary | ICD-10-CM | POA: Diagnosis not present

## 2017-12-25 DIAGNOSIS — T8389XA Other specified complication of genitourinary prosthetic devices, implants and grafts, initial encounter: Secondary | ICD-10-CM | POA: Insufficient documentation

## 2017-12-25 HISTORY — DX: Other specified postprocedural states: R11.2

## 2017-12-25 HISTORY — DX: Frequency of micturition: R35.0

## 2017-12-25 HISTORY — DX: Dysuria: R30.0

## 2017-12-25 HISTORY — DX: Other specified postprocedural states: Z98.890

## 2017-12-25 HISTORY — PX: CYSTOSCOPY W/ URETERAL STENT REMOVAL: SHX1430

## 2017-12-25 HISTORY — DX: Family history of other specified conditions: Z84.89

## 2017-12-25 HISTORY — DX: Urgency of urination: R39.15

## 2017-12-25 HISTORY — DX: Presence of urogenital implants: Z96.0

## 2017-12-25 HISTORY — PX: CYSTOSCOPY WITH RETROGRADE PYELOGRAM, URETEROSCOPY AND STENT PLACEMENT: SHX5789

## 2017-12-25 HISTORY — DX: Calculus of ureter: N20.1

## 2017-12-25 LAB — POCT PREGNANCY, URINE: Preg Test, Ur: NEGATIVE

## 2017-12-25 SURGERY — CYSTOURETEROSCOPY, WITH RETROGRADE PYELOGRAM AND STENT INSERTION
Anesthesia: General | Site: Ureter | Laterality: Left

## 2017-12-25 MED ORDER — DEXAMETHASONE SODIUM PHOSPHATE 10 MG/ML IJ SOLN
INTRAMUSCULAR | Status: AC
  Filled 2017-12-25: qty 1

## 2017-12-25 MED ORDER — KETOROLAC TROMETHAMINE 30 MG/ML IJ SOLN
INTRAMUSCULAR | Status: DC | PRN
  Administered 2017-12-25: 30 mg via INTRAVENOUS

## 2017-12-25 MED ORDER — FENTANYL CITRATE (PF) 100 MCG/2ML IJ SOLN
INTRAMUSCULAR | Status: AC
  Filled 2017-12-25: qty 2

## 2017-12-25 MED ORDER — TRAMADOL HCL 50 MG PO TABS: 50.0000 mg | ORAL_TABLET | Freq: Four times a day (QID) | ORAL | 0 refills | Status: DC | PRN

## 2017-12-25 MED ORDER — PROPOFOL 10 MG/ML IV BOLUS
INTRAVENOUS | Status: DC | PRN
  Administered 2017-12-25: 200 mg via INTRAVENOUS
  Administered 2017-12-25 (×2): 50 mg via INTRAVENOUS

## 2017-12-25 MED ORDER — IOHEXOL 300 MG/ML  SOLN: INTRAMUSCULAR | Status: DC | PRN

## 2017-12-25 MED ORDER — MIDAZOLAM HCL 2 MG/2ML IJ SOLN
INTRAMUSCULAR | Status: AC
Start: 2017-12-25 — End: ?
  Filled 2017-12-25: qty 2

## 2017-12-25 MED ORDER — CIPROFLOXACIN IN D5W 400 MG/200ML IV SOLN
400.0000 mg | Freq: Two times a day (BID) | INTRAVENOUS | Status: DC
  Filled 2017-12-25: qty 200

## 2017-12-25 MED ORDER — FENTANYL CITRATE (PF) 100 MCG/2ML IJ SOLN
INTRAMUSCULAR | Status: DC | PRN
  Administered 2017-12-25: 50 ug via INTRAVENOUS

## 2017-12-25 MED ORDER — PROPOFOL 10 MG/ML IV BOLUS
INTRAVENOUS | Status: AC
  Filled 2017-12-25: qty 40

## 2017-12-25 MED ORDER — LIDOCAINE 2% (20 MG/ML) 5 ML SYRINGE
INTRAMUSCULAR | Status: DC | PRN
  Administered 2017-12-25: 60 mg via INTRAVENOUS

## 2017-12-25 MED ORDER — LIDOCAINE 2% (20 MG/ML) 5 ML SYRINGE
INTRAMUSCULAR | Status: AC
  Filled 2017-12-25: qty 5

## 2017-12-25 MED ORDER — ONDANSETRON HCL 4 MG/2ML IJ SOLN
INTRAMUSCULAR | Status: AC
  Filled 2017-12-25: qty 2

## 2017-12-25 MED ORDER — DEXAMETHASONE SODIUM PHOSPHATE 10 MG/ML IJ SOLN
INTRAMUSCULAR | Status: DC | PRN
  Administered 2017-12-25: 10 mg via INTRAVENOUS

## 2017-12-25 MED ORDER — ONDANSETRON HCL 4 MG/2ML IJ SOLN
INTRAMUSCULAR | Status: DC | PRN
  Administered 2017-12-25: 4 mg via INTRAVENOUS

## 2017-12-25 MED ORDER — CEFAZOLIN (ANCEF) 1 G IV SOLR
1.0000 g | INTRAVENOUS | Status: AC
  Administered 2017-12-25: 1 g
  Filled 2017-12-25: qty 1

## 2017-12-25 MED ORDER — CEFAZOLIN SODIUM-DEXTROSE 1-4 GM/50ML-% IV SOLN
INTRAVENOUS | Status: AC
  Filled 2017-12-25: qty 50

## 2017-12-25 MED ORDER — SODIUM CHLORIDE 0.9 % IR SOLN
Status: DC | PRN
  Administered 2017-12-25: 3000 mL

## 2017-12-25 MED ORDER — ACETAMINOPHEN 10 MG/ML IV SOLN
INTRAVENOUS | Status: DC | PRN
  Administered 2017-12-25: 1000 mg via INTRAVENOUS

## 2017-12-25 MED ORDER — MIDAZOLAM HCL 2 MG/2ML IJ SOLN
INTRAMUSCULAR | Status: DC | PRN
  Administered 2017-12-25: 1 mg via INTRAVENOUS

## 2017-12-25 MED ORDER — LACTATED RINGERS IV SOLN
INTRAVENOUS | Status: DC
  Administered 2017-12-25: 12:00:00 via INTRAVENOUS
  Filled 2017-12-25: qty 1000

## 2017-12-25 MED ORDER — CIPROFLOXACIN IN D5W 400 MG/200ML IV SOLN
INTRAVENOUS | Status: AC
  Filled 2017-12-25: qty 200

## 2017-12-25 MED ORDER — CEFAZOLIN SODIUM 1 G IJ SOLR
INTRAMUSCULAR | Status: AC
  Filled 2017-12-25: qty 10

## 2017-12-25 SURGICAL SUPPLY — 27 items
BAG DRAIN URO-CYSTO SKYTR STRL (DRAIN) ×5 IMPLANT
BASKET STONE 1.7 NGAGE (UROLOGICAL SUPPLIES) ×5 IMPLANT
BASKET ZERO TIP NITINOL 2.4FR (BASKET) IMPLANT
CATH INTERMIT  6FR 70CM (CATHETERS) IMPLANT
CATH URET 5FR 28IN CONE TIP (BALLOONS)
CATH URET 5FR 70CM CONE TIP (BALLOONS) IMPLANT
CLOTH BEACON ORANGE TIMEOUT ST (SAFETY) ×5 IMPLANT
ELECT REM PT RETURN 9FT ADLT (ELECTROSURGICAL)
ELECTRODE REM PT RTRN 9FT ADLT (ELECTROSURGICAL) IMPLANT
FIBER LASER FLEXIVA 200 (UROLOGICAL SUPPLIES) IMPLANT
FIBER LASER FLEXIVA 365 (UROLOGICAL SUPPLIES) IMPLANT
FIBER LASER TRAC TIP (UROLOGICAL SUPPLIES) IMPLANT
GLOVE BIO SURGEON STRL SZ8 (GLOVE) ×5 IMPLANT
GOWN STRL REUS W/ TWL XL LVL3 (GOWN DISPOSABLE) ×6 IMPLANT
GOWN STRL REUS W/TWL XL LVL3 (GOWN DISPOSABLE) ×4
GUIDEWIRE ANG ZIPWIRE 038X150 (WIRE) IMPLANT
GUIDEWIRE STR DUAL SENSOR (WIRE) ×5 IMPLANT
INFUSOR MANOMETER BAG 3000ML (MISCELLANEOUS) IMPLANT
IV NS IRRIG 3000ML ARTHROMATIC (IV SOLUTION) ×5 IMPLANT
KIT TURNOVER CYSTO (KITS) ×5 IMPLANT
MANIFOLD NEPTUNE II (INSTRUMENTS) ×5 IMPLANT
NS IRRIG 500ML POUR BTL (IV SOLUTION) ×5 IMPLANT
PACK CYSTO (CUSTOM PROCEDURE TRAY) ×5 IMPLANT
SHEATH ACCESS URETERAL 38CM (SHEATH) IMPLANT
TUBE CONNECTING 12'X1/4 (SUCTIONS)
TUBE CONNECTING 12X1/4 (SUCTIONS) IMPLANT
TUBING UROLOGY SET (TUBING) ×5 IMPLANT

## 2017-12-25 NOTE — Anesthesia Procedure Notes (Signed)
Procedure Name: LMA Insertion Date/Time: 12/25/2017 1:02 PM Performed by: Tyrone Nine, CRNA Pre-anesthesia Checklist: Patient identified, Emergency Drugs available, Suction available, Patient being monitored and Timeout performed Patient Re-evaluated:Patient Re-evaluated prior to induction Oxygen Delivery Method: Circle system utilized Preoxygenation: Pre-oxygenation with 100% oxygen Induction Type: IV induction Ventilation: Mask ventilation without difficulty LMA: LMA inserted LMA Size: 4.0 Number of attempts: 1 Placement Confirmation: breath sounds checked- equal and bilateral,  CO2 detector and positive ETCO2 Tube secured with: Tape Dental Injury: Teeth and Oropharynx as per pre-operative assessment

## 2017-12-25 NOTE — Anesthesia Preprocedure Evaluation (Addendum)
Anesthesia Evaluation  Patient identified by MRN, date of birth, ID band Patient awake    Reviewed: Allergy & Precautions, NPO status , Patient's Chart, lab work & pertinent test results  History of Anesthesia Complications (+) PONV  Airway Mallampati: II  TM Distance: >3 FB Neck ROM: Full    Dental no notable dental hx. (+) Teeth Intact, Dental Advisory Given   Pulmonary Current Smoker,    Pulmonary exam normal breath sounds clear to auscultation       Cardiovascular hypertension, Normal cardiovascular exam Rhythm:Regular Rate:Normal     Neuro/Psych Anxiety Depression negative neurological ROS  negative psych ROS   GI/Hepatic negative GI ROS, Neg liver ROS,   Endo/Other  negative endocrine ROS  Renal/GU negative Renal ROS  negative genitourinary   Musculoskeletal negative musculoskeletal ROS (+)   Abdominal   Peds negative pediatric ROS (+)  Hematology negative hematology ROS (+)   Anesthesia Other Findings Postpartum 2 months- not breast feeding 5 yr Hx of sobriety from Heroin  Reproductive/Obstetrics negative OB ROS                           Anesthesia Physical Anesthesia Plan  ASA: II  Anesthesia Plan: General   Post-op Pain Management:    Induction: Intravenous  PONV Risk Score and Plan: 3 and Ondansetron, Dexamethasone and Treatment may vary due to age or medical condition  Airway Management Planned: LMA  Additional Equipment:   Intra-op Plan:   Post-operative Plan: Extubation in OR  Informed Consent: I have reviewed the patients History and Physical, chart, labs and discussed the procedure including the risks, benefits and alternatives for the proposed anesthesia with the patient or authorized representative who has indicated his/her understanding and acceptance.   Dental advisory given  Plan Discussed with: CRNA and Surgeon  Anesthesia Plan Comments:          Anesthesia Quick Evaluation

## 2017-12-25 NOTE — H&P (Signed)
H&P  Chief Complaint: Left ureteral stone  History of Present Illness: [name] is a 32 y.o. year old female who presents for removal of retained left J2 stent as well as left URS. Stent was placed 5.3.18 when she presented with infected left distal ureteral stone. It has been present since then, as pt became pregnant and has not followed up properly. Recent urine culture grew B-hemolytic strep--she has been on abx for this.   Past Medical History:  Diagnosis Date  . Anxiety   . Depression   . Dysuria   . Family history of adverse reaction to anesthesia    mother-- hard to wake  . Frequency of urination   . History of kidney stones    2010;  . Hypertension   . Insomnia   . Left ureteral stone   . PONV (postoperative nausea and vomiting)   . Retained ureteral stent    left side--- placed 11-28-2016  . Urgency of urination     Past Surgical History:  Procedure Laterality Date  . BREAST ENHANCEMENT SURGERY Bilateral 2008  . CYSTOSCOPY W/ URETERAL STENT PLACEMENT  11/28/2016   Procedure: CYSTOSCOPY WITH LEFT RETROGRADE PYELOGRAM/LEFT URETERAL STENT PLACEMENT;  Surgeon: Marcine Matar, MD;  Location: [facility];  Service: Urology;;  . [place] TOOTH EXTRACTION      Home Medications:  No medications prior to admission.    Allergies:  Allergies  Allergen Reactions  . Amoxicillin Rash    Has patient had a PCN reaction causing immediate rash, facial/tongue/throat swelling, SOB or lightheadedness with hypotension: Yes Has patient had a PCN reaction causing severe rash involving mucus membranes or skin necrosis: No Has patient had a PCN reaction that required hospitalization: No Has patient had a PCN reaction occurring within the last 10 years: No If all of the above answers are "NO", then may proceed with Cephalosporin use.   Marland Kitchen Penicillins Rash    Has patient had a PCN reaction causing immediate rash, facial/tongue/throat swelling, SOB or lightheadedness with hypotension:  Yes Has patient had a PCN reaction causing severe rash involving mucus membranes or skin necrosis: No Has patient had a PCN reaction that required hospitalization: No Has patient had a PCN reaction occurring within the last 10 years: No If all of the above answers are "NO", then may proceed with Cephalosporin use.   . Sulfa Antibiotics Rash    Family History  Problem Relation Age of Onset  . Hypertension Mother   . Depression Mother   . Hyperlipidemia Father   . Hypertension Father   . ADD / ADHD Sister   . Depression Sister   . Stroke Sister   . Hypertension Sister   . ADD / ADHD Brother   . Hypertension Brother   . Stroke Maternal Grandmother   . Hypertension Maternal Grandmother   . Kidney failure Paternal Grandmother     Social History:  reports that she has been smoking cigarettes.  She has a 3.00 pack-year smoking history. She has never used smokeless tobacco. She reports that she has current or past drug history. She reports that she does not drink alcohol.  ROS: A complete review of systems was performed.  All systems are negative except for pertinent findings as noted.  Physical Exam:  Vital signs in last 24 hours:   General:  Alert and oriented, No acute distress HEENT: Normocephalic, atraumatic Neck: No JVD or lymphadenopathy Cardiovascular: Regular rate and rhythm Lungs: Clear bilaterally Abdomen: Soft, nontender, nondistended, no abdominal masses Back:  No CVA tenderness Extremities: No edema Neurologic: Grossly intact  Laboratory Data:  No results found for this or any previous visit (from the past 24 hour(s)). No results found for this or any previous visit (from the past 240 hour(s)). Creatinine: No results for input(s): CREATININE in the last 168 hours.  Radiologic Imaging: No results found.  Impression/Assessment:  Retained left ureteral stent with h/o left ureteral stone  Plan:  Cystoscopy, left ureteral stent extraction (possible use of HLL  if stone encrusted), left URS  Bertram Millard Corine Solorio 12/25/2017, 9:34 AM  Bertram Millard. Kadisha Goodine MD

## 2017-12-25 NOTE — Discharge Instructions (Signed)
1. You may see some blood in the urine and may have some burning with urination for 48-72 hours. You also may notice that you have to urinate more frequently or urgently after your procedure which is normal.  °2. You should call should you develop an inability urinate, fever > 101, persistent nausea and vomiting that prevents you from eating or drinking to stay hydrated.  °3. If you have a stent, you will likely urinate more frequently and urgently until the stent is removed and you may experience some discomfort/pain in the lower abdomen and flank especially when urinating. You may take pain medication prescribed to you if needed for pain. You may also intermittently have blood in the urine until the stent is removed. ° ° ° ° °Post Anesthesia Home Care Instructions ° °Activity: °Get plenty of rest for the remainder of the day. A responsible individual must stay with you for 24 hours following the procedure.  °For the next 24 hours, DO NOT: °-Drive a car °-Operate machinery °-Drink alcoholic beverages °-Take any medication unless instructed by your physician °-Make any legal decisions or sign important papers. ° °Meals: °Start with liquid foods such as gelatin or soup. Progress to regular foods as tolerated. Avoid greasy, spicy, heavy foods. If nausea and/or vomiting occur, drink only clear liquids until the nausea and/or vomiting subsides. Call your physician if vomiting continues. ° °Special Instructions/Symptoms: °Your throat may feel dry or sore from the anesthesia or the breathing tube placed in your throat during surgery. If this causes discomfort, gargle with warm salt water. The discomfort should disappear within 24 hours. ° °If you had a scopolamine patch placed behind your ear for the management of post- operative nausea and/or vomiting: ° °1. The medication in the patch is effective for 72 hours, after which it should be removed.  Wrap patch in a tissue and discard in the trash. Wash hands thoroughly with  soap and water. °2. You may remove the patch earlier than 72 hours if you experience unpleasant side effects which may include dry mouth, dizziness or visual disturbances. °3. Avoid touching the patch. Wash your hands with soap and water after contact with the patch. °   ° °

## 2017-12-25 NOTE — Anesthesia Postprocedure Evaluation (Signed)
Anesthesia Post Note  Patient: NUSAYBA CADENAS  Procedure(s) Performed: CYSTOSCOPY, URETEROSCOPY STONE BASKETRY (Left Renal) CYSTOSCOPY WITH STENT REMOVAL (Left Ureter)     Patient location during evaluation: PACU Anesthesia Type: General Level of consciousness: awake and alert Pain management: pain level controlled Vital Signs Assessment: post-procedure vital signs reviewed and stable Respiratory status: spontaneous breathing, nonlabored ventilation, respiratory function stable and patient connected to nasal cannula oxygen Cardiovascular status: blood pressure returned to baseline and stable Postop Assessment: no apparent nausea or vomiting Anesthetic complications: no    Last Vitals:  Vitals:   12/25/17 1149 12/25/17 1335  BP: (!) 140/102 (!) 136/94  Pulse:  72  Resp:  12  Temp:  36.7 C  SpO2: 98%     Last Pain:  Vitals:   12/25/17 1218  TempSrc:   PainSc: 0-No pain                 Jazilyn Siegenthaler S

## 2017-12-25 NOTE — Op Note (Signed)
Preoperative diagnosis: Retained left ureteral stent with history of left ureteral stone  Postoperative diagnosis: Same  Principal procedure: Cystoscopy, left double-J stent extraction, left ureteroscopy, left ureteroscopic stone extraction  Surgeon: Octavis Sheeler  Anesthesia: General with LMA  Complications: None  Drains: None  Specimen: Stone  Indications: 32 year old female who presented approximately 1 year ago with an infected left ureteral stone.  She was stented.  She was treated appropriately with IV antibiotics in hospital, with following subsequent treatment of her infection as an outpatient.  Prior to eventual planning for ureteroscopic extraction, she became pregnant.  She finally delivered in March of this year.  She presents at this time for stent extraction, with the possible use of holmium laser if there is significant calcification, ureteroscopy and stone management.  The procedure as well as risks and complications have been discussed with the patient.  She understands these and desires to proceed.  Findings: The bladder appeared normal with the exception of inflamed urothelium around the left ureteral orifice secondary to the encrusted stent.  No other urothelial lesions were noted within the bladder.  The ureter was normal with the exception of a proximal left ureteral stone.  Description of procedure: The patient was properly identified and marked in the holding area.  She received preoperative IV antibiotics.  She was then taken to the operating room where general anesthetic was administered with the LMA.  She was placed in the dorsolithotomy position.  Genitalia and perineum were prepped and draped.  Proper timeout performed.  A 22 French panendoscope was placed in the bladder which was inspected circumferentially with the above-mentioned findings.  The stone was somewhat encrusted, at least from what I could see on the intravesical part.  It was grasped and was fairly easily  removed.  The remaining stent above the intravesical part was not encrusted.  I then passed a guidewire cystoscopically up into the upper pole calyceal system.  The cystoscope was removed and placed with the semirigid dual-lumen ureteroscope which was advanced up the ureter.  The stone was seen in the proximal ureter.  It was grasped with the engage basket and easily extracted through the dilated ureter.  I then replaced the ureteroscope and inspected the ureter from the UVJ all the way to the ureteropelvic junction and into the pelvis.  No further stones were seen.  There were no obvious calcifications on preoperative KUB either.  As the stent had been indwelling long-term and the ureter was fairly dilated with minimal trauma from the procedure, I decided not to replace the stent.  The ureteroscope was removed.  The bladder was drained.  The procedure was then terminated.  The patient was then awakened and taken to the PACU in stable condition.  She tolerated the procedure well.

## 2017-12-25 NOTE — Transfer of Care (Signed)
Immediate Anesthesia Transfer of Care Note  Patient: [name]  Procedure(s) Performed: CYSTOSCOPY, URETEROSCOPY STONE EXTRACTIONS (Left Renal) CYSTOSCOPY WITH STENT REMOVAL (Left Ureter)  Patient Location: PACU  Anesthesia Type:General  Level of Consciousness: awake, alert , oriented and patient cooperative  Airway & Oxygen Therapy: Patient Spontanous Breathing and Patient connected to nasal cannula oxygen  Post-op Assessment: Report given to RN and Post -op Vital signs reviewed and stable  Post vital signs: Reviewed and stable  Last Vitals:  Vitals Value Taken Time  BP 136/94 12/25/2017  1:35 PM  Temp 36.7 C 12/25/2017  1:35 PM  Pulse 80 12/25/2017  1:38 PM  Resp 11 12/25/2017  1:38 PM  SpO2 100 % 12/25/2017  1:38 PM  Vitals shown include unvalidated device data.  Last Pain:  Vitals:   12/25/17 1218  TempSrc:   PainSc: 0-No pain      Patients Stated Pain Goal: 8 (12/25/17 1218)  Complications: No apparent anesthesia complications

## 2017-12-26 ENCOUNTER — Encounter (HOSPITAL_BASED_OUTPATIENT_CLINIC_OR_DEPARTMENT_OTHER): Payer: Self-pay | Admitting: Urology

## 2018-01-15 ENCOUNTER — Emergency Department (HOSPITAL_BASED_OUTPATIENT_CLINIC_OR_DEPARTMENT_OTHER)
Admission: EM | Admit: 2018-01-15 | Discharge: 2018-01-15 | Disposition: A | Discharge status: Home / Self Care | Payer: Medicaid Other | Attending: Emergency Medicine | Admitting: Emergency Medicine

## 2018-01-15 ENCOUNTER — Other Ambulatory Visit: Payer: Self-pay

## 2018-01-15 ENCOUNTER — Emergency Department (HOSPITAL_BASED_OUTPATIENT_CLINIC_OR_DEPARTMENT_OTHER): Payer: Medicaid Other

## 2018-01-15 ENCOUNTER — Encounter (HOSPITAL_BASED_OUTPATIENT_CLINIC_OR_DEPARTMENT_OTHER): Payer: Self-pay | Admitting: *Deleted

## 2018-01-15 DIAGNOSIS — F17210 Nicotine dependence, cigarettes, uncomplicated: Secondary | ICD-10-CM | POA: Diagnosis not present

## 2018-01-15 DIAGNOSIS — M549 Dorsalgia, unspecified: Secondary | ICD-10-CM | POA: Diagnosis present

## 2018-01-15 DIAGNOSIS — R55 Syncope and collapse: Secondary | ICD-10-CM | POA: Diagnosis not present

## 2018-01-15 DIAGNOSIS — M25562 Pain in left knee: Secondary | ICD-10-CM | POA: Insufficient documentation

## 2018-01-15 DIAGNOSIS — I10 Essential (primary) hypertension: Secondary | ICD-10-CM | POA: Diagnosis not present

## 2018-01-15 DIAGNOSIS — Z79899 Other long term (current) drug therapy: Secondary | ICD-10-CM | POA: Diagnosis not present

## 2018-01-15 DIAGNOSIS — R0789 Other chest pain: Secondary | ICD-10-CM | POA: Diagnosis not present

## 2018-01-15 DIAGNOSIS — M79601 Pain in right arm: Secondary | ICD-10-CM | POA: Insufficient documentation

## 2018-01-15 DIAGNOSIS — M542 Cervicalgia: Secondary | ICD-10-CM | POA: Diagnosis not present

## 2018-01-15 DIAGNOSIS — M25511 Pain in right shoulder: Secondary | ICD-10-CM | POA: Insufficient documentation

## 2018-01-15 LAB — PREGNANCY, URINE: Preg Test, Ur: NEGATIVE

## 2018-01-15 MED ORDER — METHOCARBAMOL 500 MG PO TABS: 500.0000 mg | ORAL_TABLET | Freq: Two times a day (BID) | ORAL | 0 refills | Status: DC

## 2018-01-15 MED ORDER — IBUPROFEN 800 MG PO TABS: 800.0000 mg | ORAL_TABLET | Freq: Three times a day (TID) | ORAL | 0 refills | Status: DC

## 2018-01-15 MED ORDER — ACETAMINOPHEN 500 MG PO TABS: 500.0000 mg | ORAL_TABLET | Freq: Four times a day (QID) | ORAL | 0 refills | Status: DC | PRN

## 2018-01-15 MED ORDER — HYDROCODONE-ACETAMINOPHEN 5-325 MG PO TABS
2.0000 | ORAL_TABLET | Freq: Once | ORAL | Status: AC
Start: 2018-01-15 — End: 2018-01-15
  Administered 2018-01-15: 2 via ORAL
  Filled 2018-01-15: qty 2

## 2018-01-15 MED FILL — IBUPROFEN 800 MG TAB: 800 | 7 days supply | Qty: 21 | Fill #0

## 2018-01-15 MED FILL — METHOCARBAMOL 500 MG TABLET: 500 | 10 days supply | Qty: 20 | Fill #0

## 2018-01-15 MED FILL — ACETAMINOPHEN 500 MG TABS: 500 | 25 days supply | Qty: 100 | Fill #0

## 2018-01-15 NOTE — ED Notes (Signed)
Signature pad not working for d/c signature. Pt verbalized understanding of d/c instructions.
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
## 2018-04-11 ENCOUNTER — Emergency Department (HOSPITAL_COMMUNITY): Payer: Medicaid Other

## 2018-04-11 ENCOUNTER — Emergency Department (HOSPITAL_COMMUNITY)
Admission: EM | Admit: 2018-04-11 | Discharge: 2018-04-11 | Disposition: A | Discharge status: Home / Self Care | Payer: Medicaid Other | Attending: Emergency Medicine | Admitting: Emergency Medicine

## 2018-04-11 ENCOUNTER — Encounter (HOSPITAL_COMMUNITY): Payer: Self-pay | Admitting: Emergency Medicine

## 2018-04-11 DIAGNOSIS — F17210 Nicotine dependence, cigarettes, uncomplicated: Secondary | ICD-10-CM | POA: Insufficient documentation

## 2018-04-11 DIAGNOSIS — T401X1A Poisoning by heroin, accidental (unintentional), initial encounter: Secondary | ICD-10-CM | POA: Diagnosis not present

## 2018-04-11 DIAGNOSIS — I10 Essential (primary) hypertension: Secondary | ICD-10-CM | POA: Diagnosis not present

## 2018-04-11 LAB — RAPID URINE DRUG SCREEN, HOSP PERFORMED
Amphetamines: NOT DETECTED
Barbiturates: NOT DETECTED
Benzodiazepines: POSITIVE — AB
Cocaine: POSITIVE — AB
Opiates: POSITIVE — AB
Tetrahydrocannabinol: NOT DETECTED

## 2018-04-11 LAB — COMPREHENSIVE METABOLIC PANEL
ALBUMIN: 3.6 g/dL (ref 3.5–5.0)
ALK PHOS: 91 U/L (ref 38–126)
ALT: 77 U/L — AB (ref 0–44)
ANION GAP: 10 (ref 5–15)
AST: 128 U/L — ABNORMAL HIGH (ref 15–41)
BUN: 12 mg/dL (ref 6–20)
CALCIUM: 8.5 mg/dL — AB (ref 8.9–10.3)
CHLORIDE: 105 mmol/L (ref 98–111)
CO2: 27 mmol/L (ref 22–32)
CREATININE: 0.69 mg/dL (ref 0.44–1.00)
GFR calc non Af Amer: >60 mL/min (ref >60)
GLUCOSE: 203 mg/dL — AB (ref 70–99)
Potassium: 3.4 mmol/L — ABNORMAL LOW (ref 3.5–5.1)
SODIUM: 142 mmol/L (ref 135–145)
Total Bilirubin: 0.5 mg/dL (ref 0.3–1.2)
Total Protein: 6.6 g/dL (ref 6.5–8.1)

## 2018-04-11 LAB — CBC WITH DIFFERENTIAL/PLATELET
Basophils Absolute: 0 10*3/uL (ref 0.0–0.1)
Basophils Relative: 0 %
EOS ABS: 0.4 10*3/uL (ref 0.0–0.7)
EOS PCT: 4 %
HCT: 40.4 % (ref 36.0–46.0)
Hemoglobin: 13.3 g/dL (ref 12.0–15.0)
LYMPHS ABS: 2.5 10*3/uL (ref 0.7–4.0)
LYMPHS PCT: 26 %
MCH: 30.5 pg (ref 26.0–34.0)
MCHC: 32.9 g/dL (ref 30.0–36.0)
MCV: 92.7 fL (ref 78.0–100.0)
MONO ABS: 0.5 10*3/uL (ref 0.1–1.0)
MONOS PCT: 5 %
Neutro Abs: 6.2 10*3/uL (ref 1.7–7.7)
Neutrophils Relative %: 65 %
Platelets: 355 10*3/uL (ref 150–400)
RBC: 4.36 MIL/uL (ref 3.87–5.11)
RDW: 13.4 % (ref 11.5–15.5)
WBC: 9.6 10*3/uL (ref 4.0–10.5)

## 2018-04-11 LAB — I-STAT BETA HCG BLOOD, ED (MC, WL, AP ONLY): I-stat hCG, quantitative: <5 m[IU]/mL

## 2018-04-11 LAB — SALICYLATE LEVEL: Salicylate Lvl: <7 mg/dL (ref 2.8–30.0)

## 2018-04-11 LAB — ETHANOL

## 2018-04-11 LAB — ACETAMINOPHEN LEVEL: Acetaminophen (Tylenol), Serum: <10 ug/mL — ABNORMAL LOW (ref 10–30)
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
## 2018-04-11 NOTE — ED Provider Notes (Addendum)
[facility]-EMERGENCY DEPT Provider Note   CSN: [id number] Arrival date & time: 04/11/18  0431     History   Chief Complaint Chief Complaint  Patient presents with  . Drug Overdose    HPI [name] is a 32 y.o. female.  The history is provided by the patient.  Drug Overdose  This is a recurrent problem. The current episode started less than 1 hour ago. The problem occurs constantly. The problem has been rapidly improving. Pertinent negatives include no chest pain, no abdominal pain, no headaches and no shortness of breath. Nothing aggravates the symptoms. Nothing relieves the symptoms. Treatments tried: narcan. The treatment provided significant relief.  States she normally snorts heroin but injected tonight.    Past Medical History:  Diagnosis Date  . Anxiety   . Depression   . Dysuria   . Family history of adverse reaction to anesthesia    mother-- hard to wake  . Frequency of urination   . History of kidney stones    2010;  . Hypertension   . Insomnia   . Left ureteral stone   . PONV (postoperative nausea and vomiting)   . Retained ureteral stent    left side--- placed 11-28-2016  . Urgency of urination     Patient Active Problem List   Diagnosis Date Noted  . Gestational hypertension 10/10/2017  . Depression with anxiety 04/18/2017  . Supervision of normal first pregnancy 04/17/2017  . Pregnancy complicated by subutex maintenance, antepartum (HCC) 04/17/2017  . Kidney stones w/ Lt ureteral stent 03/24/2017  . History of heroin abuse 03/24/2017    Past Surgical History:  Procedure Laterality Date  . BREAST ENHANCEMENT SURGERY Bilateral 2008  . CYSTOSCOPY W/ URETERAL STENT PLACEMENT  11/28/2016   Procedure: CYSTOSCOPY WITH LEFT RETROGRADE PYELOGRAM/LEFT URETERAL STENT PLACEMENT;  Surgeon: Marcine Matarahlstedt, Stephen, MD;  Location: [facility];  Service: Urology;;  . Bluford KaufmannYSTOSCOPY W/ URETERAL STENT REMOVAL Left 12/25/2017   Procedure: CYSTOSCOPY WITH  STENT REMOVAL;  Surgeon: Marcine Matarahlstedt, Stephen, MD;  [facility] [place];  Service: Urology;  Laterality: Left;  . CYSTOSCOPY WITH RETROGRADE PYELOGRAM, URETEROSCOPY AND STENT PLACEMENT Left 12/25/2017   Procedure: CYSTOSCOPY, URETEROSCOPY STONE BASKETRY;  Surgeon: Marcine Matarahlstedt, Stephen, MD;  [facility] [place];  Service: Urology;  Laterality: Left;  . WISDOM TOOTH EXTRACTION       OB History    Gravida  1   Para  1   Term  1   Preterm      AB      Living  1     SAB      TAB      Ectopic      Multiple  0   Live Births  1            Home Medications    Prior to Admission medications   Medication Sig Start Date End Date Taking? Authorizing Provider  acetaminophen (TYLENOL) 500 MG tablet Take 1 tablet (500 mg total) by mouth every 6 (six) hours as needed. 01/15/18   Law, Waylan BogaAlexandra M, PA-C  amLODipine (NORVASC) 5 MG tablet Take 1 tablet (5 mg total) by mouth daily. Patient taking differently: Take 5 mg by mouth every morning.  10/13/17   Moss, Amber, DO  ibuprofen (ADVIL,MOTRIN) 800 MG tablet Take 1 tablet (800 mg total) by mouth 3 (three) times daily. 01/15/18   Law, Waylan BogaAlexandra M, PA-C  methocarbamol (ROBAXIN) 500 MG tablet Take 1 tablet (500 mg total) by  mouth 2 (two) times daily. 01/15/18   Law, Waylan Boga, PA-C  oxybutynin (DITROPAN) 5 MG tablet Take 5 mg by mouth 3 (three) times daily.    [provider]  traMADol (ULTRAM) 50 MG tablet Take 1 tablet (50 mg total) by mouth every 6 (six) hours as needed. 12/25/17   Marcine Matar, MD    Family History Family History  Problem Relation Age of Onset  . Hypertension Mother   . Depression Mother   . Hyperlipidemia Father   . Hypertension Father   . ADD / ADHD Sister   . Depression Sister   . Stroke Sister   . Hypertension Sister   . ADD / ADHD Brother   . Hypertension Brother   . Stroke Maternal Grandmother   . Hypertension Maternal Grandmother   . Kidney failure Paternal  Grandmother     Social History Social History   Tobacco Use  . Smoking status: Current Every Day Smoker    Packs/day: 0.50    Years: 6.00    Pack years: 3.00    Types: Cigarettes  . Smokeless tobacco: Never Used  Substance Use Topics  . Alcohol use: No  . Drug use: Not Currently    Comment: 12-23-2017 hx opiod use--- last used 1 yr ago 2018     Allergies   Amoxicillin; Penicillins; and Sulfa antibiotics   Review of Systems Review of Systems  Unable to perform ROS: Other  Constitutional: Negative for fever.  Respiratory: Negative for shortness of breath.   Cardiovascular: Negative for chest pain, palpitations and leg swelling.  Gastrointestinal: Negative for abdominal pain.  Neurological: Negative for headaches.     Physical Exam Updated Vital Signs BP 119/79   Pulse 78   Temp 98.4 F (36.9 C) (Oral)   Resp (!) 9   LMP  (LMP Unknown)   SpO2 100% Comment: non breather   Physical Exam  Constitutional: She appears well-developed and well-nourished. No distress.  HENT:  Head: Normocephalic and atraumatic.  Mouth/Throat: No oropharyngeal exudate.  Eyes: Conjunctivae and EOM are normal.  Pinpoint pupils  Neck:  In an c collar  Cardiovascular: Normal rate, regular rhythm, normal heart sounds and intact distal pulses.  Pulmonary/Chest: Effort normal and breath sounds normal. No stridor. She has no wheezes. She has no rales.  Abdominal: Soft. Bowel sounds are normal. She exhibits no mass. There is no tenderness. There is no rebound and no guarding.  Musculoskeletal: Normal range of motion. She exhibits no tenderness or deformity.  Neurological: She is alert.  Skin: Skin is warm and dry. Capillary refill takes less than 2 seconds.  Psychiatric: Thought content normal.     ED Treatments / Results  Labs (all labs ordered are listed, but only abnormal results are displayed) Results for orders placed or performed during the hospital encounter of 04/11/18    Comprehensive metabolic panel  Result Value Ref Range   Sodium 142 135 - 145 mmol/L   Potassium 3.4 (L) 3.5 - 5.1 mmol/L   Chloride 105 98 - 111 mmol/L   CO2 27 22 - 32 mmol/L   Glucose, Bld 203 (H) 70 - 99 mg/dL   BUN 12 6 - 20 mg/dL   Creatinine, Ser 1.61 0.44 - 1.00 mg/dL   Calcium 8.5 (L) 8.9 - 10.3 mg/dL   Total Protein 6.6 6.5 - 8.1 g/dL   Albumin 3.6 3.5 - 5.0 g/dL   AST 096 (H) 15 - 41 U/L   ALT 77 (H) 0 - 44  U/L   Alkaline Phosphatase 91 38 - 126 U/L   Total Bilirubin 0.5 0.3 - 1.2 mg/dL   GFR calc non Af Amer >60 >60 mL/min   GFR calc Af Amer >60 >60 mL/min   Anion gap 10 5 - 15  CBC WITH DIFFERENTIAL  Result Value Ref Range   WBC 9.6 4.0 - 10.5 K/uL   RBC 4.36 3.87 - 5.11 MIL/uL   Hemoglobin 13.3 12.0 - 15.0 g/dL   HCT 16.1 09.6 - 04.5 %   MCV 92.7 78.0 - 100.0 fL   MCH 30.5 26.0 - 34.0 pg   MCHC 32.9 30.0 - 36.0 g/dL   RDW 40.9 81.1 - 91.4 %   Platelets 355 150 - 400 K/uL   Neutrophils Relative % 65 %   Neutro Abs 6.2 1.7 - 7.7 K/uL   Lymphocytes Relative 26 %   Lymphs Abs 2.5 0.7 - 4.0 K/uL   Monocytes Relative 5 %   Monocytes Absolute 0.5 0.1 - 1.0 K/uL   Eosinophils Relative 4 %   Eosinophils Absolute 0.4 0.0 - 0.7 K/uL   Basophils Relative 0 %   Basophils Absolute 0.0 0.0 - 0.1 K/uL  I-Stat beta hCG blood, ED  Result Value Ref Range   I-stat hCG, quantitative <5.0 <5 mIU/mL   Comment 3           Dg Chest Port 1 View  Result Date: 04/11/2018 CLINICAL DATA:  Overdose. EXAM: PORTABLE CHEST 1 VIEW COMPARISON:  01/15/2018 FINDINGS: Very low lung volumes limit assessment. Crowding of bronchovascular structures and bibasilar atelectasis. No large pleural effusion or pneumothorax. Gaseous gastric distention in the upper abdomen. IMPRESSION: Hypoventilatory chest with bibasilar atelectasis. Electronically Signed   By: Narda Rutherford M.D.   On: 04/11/2018 05:19    EKG None  Radiology Dg Chest Port 1 View  Result Date: 04/11/2018 CLINICAL DATA:   Overdose. EXAM: PORTABLE CHEST 1 VIEW COMPARISON:  01/15/2018 FINDINGS: Very low lung volumes limit assessment. Crowding of bronchovascular structures and bibasilar atelectasis. No large pleural effusion or pneumothorax. Gaseous gastric distention in the upper abdomen. IMPRESSION: Hypoventilatory chest with bibasilar atelectasis. Electronically Signed   By: Narda Rutherford M.D.   On: 04/11/2018 05:19    Procedures Procedures (including critical care time)   Final Clinical Impressions(s) / ED Diagnoses  Patient has PO challenged and is talking.  Father is at the bedside.    Return for fevers >100.4 unrelieved by medication, shortness of breath, intractable vomiting, or diarrhea, Inability to tolerate liquids or food, cough, altered mental status or any concerns. No signs of systemic illness or infection. The patient is nontoxic-appearing on exam and vital signs are within normal limits.   I have reviewed the triage vital signs and the nursing notes. Pertinent labs &imaging results that were available during my care of the patient were reviewed by me and considered in my medical decision making (see chart for details).  After history, exam, and medical workup I feel the patient has been appropriately medically screened and is safe for discharge home. Pertinent diagnoses were discussed with the patient. Patient was given return precautions.   Jahayra Mazo, MD 04/11/18 7829    Cy Blamer, MD 04/11/18 0730

## 2018-04-11 NOTE — ED Triage Notes (Signed)
Pt comes to ed via ems, heroin overdose, Iv narcan given 0.4 at 4:04 am gag reflex  4:05 returned, post vomiting possible Aspiration. Pt was down appx 15 mins before ems arrived,  Was blue  with pulse but by standers did cpr despite.  V/s on arrival 140/103, pulse 94, cbg 136, non beather 100.

## 2018-04-11 NOTE — ED Notes (Signed)
Po challenged  
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
## 2022-08-23 DIAGNOSIS — F17210 Nicotine dependence, cigarettes, uncomplicated: Secondary | ICD-10-CM | POA: Diagnosis not present

## 2022-08-23 DIAGNOSIS — Z1329 Encounter for screening for other suspected endocrine disorder: Secondary | ICD-10-CM | POA: Diagnosis not present

## 2022-08-23 DIAGNOSIS — J069 Acute upper respiratory infection, unspecified: Secondary | ICD-10-CM | POA: Diagnosis not present

## 2022-08-23 DIAGNOSIS — R300 Dysuria: Secondary | ICD-10-CM | POA: Diagnosis not present

## 2022-08-23 DIAGNOSIS — F419 Anxiety disorder, unspecified: Secondary | ICD-10-CM | POA: Diagnosis not present

## 2022-08-23 DIAGNOSIS — R8290 Unspecified abnormal findings in urine: Secondary | ICD-10-CM | POA: Diagnosis not present

## 2022-08-23 DIAGNOSIS — Z13220 Encounter for screening for lipoid disorders: Secondary | ICD-10-CM | POA: Diagnosis not present

## 2022-08-23 DIAGNOSIS — Z130 Encounter for screening for diseases of the blood and blood-forming organs and certain disorders involving the immune mechanism: Secondary | ICD-10-CM | POA: Diagnosis not present

## 2022-08-23 DIAGNOSIS — I10 Essential (primary) hypertension: Secondary | ICD-10-CM | POA: Diagnosis not present

## 2022-08-23 DIAGNOSIS — R059 Cough, unspecified: Secondary | ICD-10-CM | POA: Diagnosis not present

## 2022-08-23 DIAGNOSIS — E569 Vitamin deficiency, unspecified: Secondary | ICD-10-CM | POA: Diagnosis not present

## 2022-09-24 DIAGNOSIS — Z0000 Encounter for general adult medical examination without abnormal findings: Secondary | ICD-10-CM | POA: Diagnosis not present

## 2022-09-24 DIAGNOSIS — Z124 Encounter for screening for malignant neoplasm of cervix: Secondary | ICD-10-CM | POA: Diagnosis not present

## 2022-09-24 DIAGNOSIS — Z1151 Encounter for screening for human papillomavirus (HPV): Secondary | ICD-10-CM | POA: Diagnosis not present

## 2022-10-25 DIAGNOSIS — F419 Anxiety disorder, unspecified: Secondary | ICD-10-CM | POA: Diagnosis not present

## 2022-10-25 DIAGNOSIS — Z0000 Encounter for general adult medical examination without abnormal findings: Secondary | ICD-10-CM | POA: Diagnosis not present

## 2022-10-25 DIAGNOSIS — Z7189 Other specified counseling: Secondary | ICD-10-CM | POA: Diagnosis not present

## 2022-10-25 DIAGNOSIS — F3342 Major depressive disorder, recurrent, in full remission: Secondary | ICD-10-CM | POA: Diagnosis not present

## 2022-11-15 DIAGNOSIS — F3342 Major depressive disorder, recurrent, in full remission: Secondary | ICD-10-CM | POA: Diagnosis not present

## 2022-11-15 DIAGNOSIS — E8881 Metabolic syndrome: Secondary | ICD-10-CM | POA: Diagnosis not present

## 2022-11-15 DIAGNOSIS — F419 Anxiety disorder, unspecified: Secondary | ICD-10-CM | POA: Diagnosis not present

## 2022-12-31 DIAGNOSIS — F411 Generalized anxiety disorder: Secondary | ICD-10-CM | POA: Diagnosis not present

## 2022-12-31 DIAGNOSIS — F3342 Major depressive disorder, recurrent, in full remission: Secondary | ICD-10-CM | POA: Diagnosis not present

## 2022-12-31 DIAGNOSIS — R7303 Prediabetes: Secondary | ICD-10-CM | POA: Diagnosis not present

## 2022-12-31 DIAGNOSIS — I10 Essential (primary) hypertension: Secondary | ICD-10-CM | POA: Diagnosis not present

## 2023-03-12 DIAGNOSIS — I499 Cardiac arrhythmia, unspecified: Secondary | ICD-10-CM | POA: Diagnosis not present

## 2023-03-12 DIAGNOSIS — Z6831 Body mass index (BMI) 31.0-31.9, adult: Secondary | ICD-10-CM | POA: Diagnosis not present

## 2023-03-12 DIAGNOSIS — R002 Palpitations: Secondary | ICD-10-CM | POA: Diagnosis not present

## 2023-03-12 DIAGNOSIS — F411 Generalized anxiety disorder: Secondary | ICD-10-CM | POA: Diagnosis not present

## 2023-03-12 DIAGNOSIS — E6609 Other obesity due to excess calories: Secondary | ICD-10-CM | POA: Diagnosis not present

## 2023-03-12 DIAGNOSIS — R634 Abnormal weight loss: Secondary | ICD-10-CM | POA: Diagnosis not present

## 2023-04-28 DIAGNOSIS — E6609 Other obesity due to excess calories: Secondary | ICD-10-CM | POA: Diagnosis not present

## 2023-04-28 DIAGNOSIS — Z6831 Body mass index (BMI) 31.0-31.9, adult: Secondary | ICD-10-CM | POA: Diagnosis not present

## 2023-04-28 DIAGNOSIS — Z713 Dietary counseling and surveillance: Secondary | ICD-10-CM | POA: Diagnosis not present

## 2023-04-28 DIAGNOSIS — F419 Anxiety disorder, unspecified: Secondary | ICD-10-CM | POA: Diagnosis not present

## 2023-04-28 DIAGNOSIS — I10 Essential (primary) hypertension: Secondary | ICD-10-CM | POA: Diagnosis not present

## 2023-06-10 DIAGNOSIS — J0100 Acute maxillary sinusitis, unspecified: Secondary | ICD-10-CM | POA: Diagnosis not present

## 2023-06-10 DIAGNOSIS — I10 Essential (primary) hypertension: Secondary | ICD-10-CM | POA: Diagnosis not present

## 2023-06-10 DIAGNOSIS — F411 Generalized anxiety disorder: Secondary | ICD-10-CM | POA: Diagnosis not present

## 2023-08-12 DIAGNOSIS — E6609 Other obesity due to excess calories: Secondary | ICD-10-CM | POA: Diagnosis not present

## 2023-08-12 DIAGNOSIS — Z6831 Body mass index (BMI) 31.0-31.9, adult: Secondary | ICD-10-CM | POA: Diagnosis not present

## 2023-08-12 DIAGNOSIS — F411 Generalized anxiety disorder: Secondary | ICD-10-CM | POA: Diagnosis not present

## 2023-08-12 DIAGNOSIS — Z713 Dietary counseling and surveillance: Secondary | ICD-10-CM | POA: Diagnosis not present

## 2023-08-12 DIAGNOSIS — E66811 Obesity, class 1: Secondary | ICD-10-CM | POA: Diagnosis not present

## 2023-10-10 DIAGNOSIS — E6609 Other obesity due to excess calories: Secondary | ICD-10-CM | POA: Diagnosis not present

## 2023-10-10 DIAGNOSIS — Z6831 Body mass index (BMI) 31.0-31.9, adult: Secondary | ICD-10-CM | POA: Diagnosis not present

## 2023-10-10 DIAGNOSIS — E66811 Obesity, class 1: Secondary | ICD-10-CM | POA: Diagnosis not present

## 2023-10-10 DIAGNOSIS — F411 Generalized anxiety disorder: Secondary | ICD-10-CM | POA: Diagnosis not present

## 2023-10-10 DIAGNOSIS — Z713 Dietary counseling and surveillance: Secondary | ICD-10-CM | POA: Diagnosis not present

## 2023-11-18 ENCOUNTER — Emergency Department (HOSPITAL_BASED_OUTPATIENT_CLINIC_OR_DEPARTMENT_OTHER)

## 2023-11-18 ENCOUNTER — Other Ambulatory Visit: Payer: Self-pay

## 2023-11-18 ENCOUNTER — Encounter (HOSPITAL_BASED_OUTPATIENT_CLINIC_OR_DEPARTMENT_OTHER): Payer: Self-pay | Admitting: Emergency Medicine

## 2023-11-18 ENCOUNTER — Other Ambulatory Visit (HOSPITAL_BASED_OUTPATIENT_CLINIC_OR_DEPARTMENT_OTHER): Payer: Self-pay

## 2023-11-18 ENCOUNTER — Emergency Department (HOSPITAL_BASED_OUTPATIENT_CLINIC_OR_DEPARTMENT_OTHER)
Admission: EM | Admit: 2023-11-18 | Discharge: 2023-11-18 | Disposition: A | Discharge status: Home / Self Care | Source: Home / Self Care | Attending: Emergency Medicine | Admitting: Emergency Medicine

## 2023-11-18 DIAGNOSIS — F32A Depression, unspecified: Secondary | ICD-10-CM | POA: Diagnosis not present

## 2023-11-18 DIAGNOSIS — F419 Anxiety disorder, unspecified: Secondary | ICD-10-CM | POA: Diagnosis not present

## 2023-11-18 DIAGNOSIS — F17210 Nicotine dependence, cigarettes, uncomplicated: Secondary | ICD-10-CM | POA: Diagnosis not present

## 2023-11-18 DIAGNOSIS — N12 Tubulo-interstitial nephritis, not specified as acute or chronic: Secondary | ICD-10-CM | POA: Insufficient documentation

## 2023-11-18 DIAGNOSIS — R1030 Lower abdominal pain, unspecified: Secondary | ICD-10-CM | POA: Diagnosis present

## 2023-11-18 DIAGNOSIS — D72829 Elevated white blood cell count, unspecified: Secondary | ICD-10-CM | POA: Insufficient documentation

## 2023-11-18 DIAGNOSIS — R109 Unspecified abdominal pain: Secondary | ICD-10-CM | POA: Diagnosis not present

## 2023-11-18 DIAGNOSIS — Z79899 Other long term (current) drug therapy: Secondary | ICD-10-CM | POA: Insufficient documentation

## 2023-11-18 DIAGNOSIS — I10 Essential (primary) hypertension: Secondary | ICD-10-CM | POA: Diagnosis not present

## 2023-11-18 DIAGNOSIS — N10 Acute tubulo-interstitial nephritis: Secondary | ICD-10-CM | POA: Diagnosis not present

## 2023-11-18 DIAGNOSIS — E1165 Type 2 diabetes mellitus with hyperglycemia: Secondary | ICD-10-CM | POA: Diagnosis not present

## 2023-11-18 LAB — URINALYSIS, ROUTINE W REFLEX MICROSCOPIC
Bilirubin Urine: NEGATIVE
Glucose, UA: NEGATIVE mg/dL
Ketones, ur: NEGATIVE mg/dL
Nitrite: POSITIVE — AB
Protein, ur: 100 mg/dL — AB
Specific Gravity, Urine: >=1.03 (ref 1.005–1.030)
pH: 6 (ref 5.0–8.0)

## 2023-11-18 LAB — COMPREHENSIVE METABOLIC PANEL WITH GFR
ALT: 14 U/L (ref 0–44)
AST: 22 U/L (ref 15–41)
Albumin: 3.9 g/dL (ref 3.5–5.0)
Alkaline Phosphatase: 122 U/L (ref 38–126)
Anion gap: 14 (ref 5–15)
BUN: 12 mg/dL (ref 6–20)
CO2: 21 mmol/L — ABNORMAL LOW (ref 22–32)
Calcium: 9.6 mg/dL (ref 8.9–10.3)
Chloride: 99 mmol/L (ref 98–111)
Creatinine, Ser: 0.57 mg/dL (ref 0.44–1.00)
GFR, Estimated: >60 mL/min (ref >60)
Glucose, Bld: 138 mg/dL — ABNORMAL HIGH (ref 70–99)
Potassium: 4.8 mmol/L (ref 3.5–5.1)
Sodium: 134 mmol/L — ABNORMAL LOW (ref 135–145)
Total Bilirubin: 0.7 mg/dL (ref 0.0–1.2)
Total Protein: 7.4 g/dL (ref 6.5–8.1)

## 2023-11-18 LAB — CBC
HCT: 35.3 % — ABNORMAL LOW (ref 36.0–46.0)
Hemoglobin: 12.3 g/dL (ref 12.0–15.0)
MCH: 33.9 pg (ref 26.0–34.0)
MCHC: 34.8 g/dL (ref 30.0–36.0)
MCV: 97.2 fL (ref 80.0–100.0)
Platelets: 201 10*3/uL (ref 150–400)
RBC: 3.63 MIL/uL — ABNORMAL LOW (ref 3.87–5.11)
RDW: 12.8 % (ref 11.5–15.5)
WBC: 17.2 10*3/uL — ABNORMAL HIGH (ref 4.0–10.5)
nRBC: 0 % (ref 0.0–0.2)

## 2023-11-18 LAB — URINALYSIS, MICROSCOPIC (REFLEX)

## 2023-11-18 LAB — DIFFERENTIAL
Abs Immature Granulocytes: 0.13 10*3/uL — ABNORMAL HIGH (ref 0.00–0.07)
Basophils Absolute: 0 10*3/uL (ref 0.0–0.1)
Basophils Relative: 0 %
Eosinophils Absolute: 0 10*3/uL (ref 0.0–0.5)
Eosinophils Relative: 0 %
Immature Granulocytes: 1 %
Lymphocytes Relative: 4 %
Lymphs Abs: 0.7 10*3/uL (ref 0.7–4.0)
Monocytes Absolute: 1.5 10*3/uL — ABNORMAL HIGH (ref 0.1–1.0)
Monocytes Relative: 9 %
Neutro Abs: 14.4 10*3/uL — ABNORMAL HIGH (ref 1.7–7.7)
Neutrophils Relative %: 86 %

## 2023-11-18 LAB — PREGNANCY, URINE: Preg Test, Ur: NEGATIVE

## 2023-11-18 MED ORDER — PROMETHAZINE HCL 25 MG PO TABS
25.0000 mg | ORAL_TABLET | Freq: Four times a day (QID) | ORAL | 0 refills | Status: AC | PRN
  Filled 2023-11-18: qty 30, 8d supply, fill #0

## 2023-11-18 MED ORDER — MORPHINE SULFATE (PF) 2 MG/ML IV SOLN
2.0000 mg | Freq: Once | INTRAVENOUS | Status: AC
  Administered 2023-11-18: 2 mg via INTRAVENOUS
  Filled 2023-11-18: qty 1

## 2023-11-18 MED ORDER — ONDANSETRON HCL 4 MG/2ML IJ SOLN
4.0000 mg | Freq: Once | INTRAMUSCULAR | Status: AC
  Administered 2023-11-18: 4 mg via INTRAVENOUS
  Filled 2023-11-18: qty 2

## 2023-11-18 MED ORDER — IBUPROFEN 800 MG PO TABS
800.0000 mg | ORAL_TABLET | Freq: Once | ORAL | Status: AC
  Administered 2023-11-18: 800 mg via ORAL
  Filled 2023-11-18: qty 1

## 2023-11-18 MED ORDER — SODIUM CHLORIDE 0.9 % IV SOLN
1.0000 g | Freq: Once | INTRAVENOUS | Status: AC
  Administered 2023-11-18: 1 g via INTRAVENOUS
  Filled 2023-11-18: qty 10

## 2023-11-18 MED ORDER — CEFPODOXIME PROXETIL 200 MG PO TABS
200.0000 mg | ORAL_TABLET | Freq: Two times a day (BID) | ORAL | 0 refills | Status: AC
  Filled 2023-11-18: qty 14, 7d supply, fill #0

## 2023-11-18 MED ORDER — OXYCODONE-ACETAMINOPHEN 5-325 MG PO TABS
1.0000 | ORAL_TABLET | Freq: Three times a day (TID) | ORAL | 0 refills | Status: AC | PRN
  Filled 2023-11-18: qty 9, 3d supply, fill #0

## 2023-11-18 NOTE — ED Notes (Addendum)
 Pt given ginger ale c/o h/a

## 2023-11-18 NOTE — ED Notes (Signed)
 Two unsuccessful attempts for labs Patient stated that they usually need ultrasound IV

## 2023-11-18 NOTE — ED Provider Notes (Signed)
 [facility] Provider Note   CSN: [id number] Arrival date & time: 11/18/23  1122     History  Chief Complaint  Patient presents with   Dysuria    [name] is a 38 y.o. female past medical history significant for kidney stones presents today for 3 days of dysuria.  Patient also endorses left lower abdominal/flank pain, hematuria, frequency, urgency, and nausea with vomiting.  Patient denies fever, chills, diarrhea, chest pain, shortness of breath, hematemesis, any other complaints at this time.   Dysuria Associated symptoms: abdominal pain, flank pain, nausea and vomiting        Home Medications Prior to Admission medications   Medication Sig Start Date End Date Taking? Authorizing Provider  cefpodoxime  (VANTIN ) 200 MG tablet Take 1 tablet (200 mg total) by mouth 2 (two) times daily. 11/18/23  Yes Telesia Ates N, PA-C  oxyCODONE -acetaminophen  (PERCOCET/ROXICET) 5-325 MG tablet Take 1 tablet by mouth every 8 (eight) hours as needed for up to 3 days for severe pain (pain score 7-10). 11/18/23 11/21/23 Yes Carie Charity, PA-C  promethazine  (PHENERGAN ) 25 MG tablet Take 1 tablet (25 mg total) by mouth every 6 (six) hours as needed for nausea or vomiting. 11/18/23  Yes Deshana Rominger N, PA-C  acetaminophen  (TYLENOL ) 500 MG tablet Take 1 tablet (500 mg total) by mouth every 6 (six) hours as needed. 01/15/18   Law, Alexandra M, PA-C  amLODipine  (NORVASC ) 5 MG tablet Take 1 tablet (5 mg total) by mouth daily. Patient taking differently: Take 5 mg by mouth every morning.  10/13/17   Moss, Amber, DO  ibuprofen  (ADVIL ,MOTRIN ) 800 MG tablet Take 1 tablet (800 mg total) by mouth 3 (three) times daily. 01/15/18   Law, Alexandra M, PA-C  methocarbamol  (ROBAXIN ) 500 MG tablet Take 1 tablet (500 mg total) by mouth 2 (two) times daily. 01/15/18   Law, Alexandra M, PA-C  oxybutynin  (DITROPAN ) 5 MG tablet Take 5 mg by mouth 3 (three) times daily.    [provider]  traMADol  (ULTRAM ) 50 MG tablet Take 1 tablet (50 mg total) by mouth every 6 (six) hours as needed. 12/25/17   Trent Frizzle, MD      Allergies    Ketorolac , Amoxicillin, Penicillins, and Sulfa antibiotics    Review of Systems   Review of Systems  Gastrointestinal:  Positive for abdominal pain, nausea and vomiting.  Genitourinary:  Positive for dysuria, flank pain, frequency, hematuria and urgency.    Physical Exam Updated Vital Signs BP 122/73   Pulse (!) 31   Temp 97.9 F (36.6 C) (Oral)   Resp (!) 22   Ht 5\' 3"  (1.6 m)   Wt 78.9 kg   SpO2 99%   BMI 30.82 kg/m  Physical Exam Vitals and nursing note reviewed.  Constitutional:      General: She is not in acute distress.    Appearance: She is well-developed. She is ill-appearing. She is not toxic-appearing or diaphoretic.  HENT:     Head: Normocephalic and atraumatic.     Right Ear: External ear normal.     Left Ear: External ear normal.     Nose: Nose normal.     Mouth/Throat:     Mouth: Mucous membranes are moist.     Pharynx: Oropharynx is clear.  Eyes:     Conjunctiva/sclera: Conjunctivae normal.  Cardiovascular:     Rate and Rhythm: Normal rate and regular rhythm.     Pulses: Normal pulses.  Heart sounds: No murmur heard. Pulmonary:     Effort: Pulmonary effort is normal. No respiratory distress.     Breath sounds: Normal breath sounds.  Abdominal:     General: Abdomen is flat. Bowel sounds are normal.     Palpations: Abdomen is soft.     Tenderness: There is abdominal tenderness in the suprapubic area. There is left CVA tenderness.  Musculoskeletal:        General: No swelling.     Cervical back: Neck supple.  Skin:    General: Skin is warm and dry.     Capillary Refill: Capillary refill takes less than 2 seconds.  Neurological:     General: No focal deficit present.     Mental Status: She is alert and oriented to person, place, and time.  Psychiatric:        Mood and Affect:  Mood normal.     ED Results / Procedures / Treatments   Labs (all labs ordered are listed, but only abnormal results are displayed) Labs Reviewed  COMPREHENSIVE METABOLIC PANEL WITH GFR - Abnormal; Notable for the following components:      Result Value   Sodium 134 (*)    CO2 21 (*)    Glucose, Bld 138 (*)    All other components within normal limits  CBC - Abnormal; Notable for the following components:   WBC 17.2 (*)    RBC 3.63 (*)    HCT 35.3 (*)    All other components within normal limits  URINALYSIS, ROUTINE W REFLEX MICROSCOPIC - Abnormal; Notable for the following components:   APPearance HAZY (*)    Hgb urine dipstick TRACE (*)    Protein, ur 100 (*)    Nitrite POSITIVE (*)    Leukocytes,Ua SMALL (*)    All other components within normal limits  URINALYSIS, MICROSCOPIC (REFLEX) - Abnormal; Notable for the following components:   Bacteria, UA MANY (*)    All other components within normal limits  DIFFERENTIAL - Abnormal; Notable for the following components:   Neutro Abs 14.4 (*)    Monocytes Absolute 1.5 (*)    Abs Immature Granulocytes 0.13 (*)    All other components within normal limits  URINE CULTURE  PREGNANCY, URINE    EKG None  Radiology CT Renal Stone Study Result Date: 11/18/2023 CLINICAL DATA:  Abdominal/flank pain, stone suspected EXAM: CT ABDOMEN AND PELVIS WITHOUT CONTRAST TECHNIQUE: Multidetector CT imaging of the abdomen and pelvis was performed following the standard protocol without IV contrast. RADIATION DOSE REDUCTION: This exam was performed according to the departmental dose-optimization program which includes automated exposure control, adjustment of the mA and/or kV according to patient size and/or use of iterative reconstruction technique. COMPARISON:  Nov 28, 2016 FINDINGS: Of note, the lack of intravenous contrast limits evaluation of the solid organ parenchyma and vascularity. Lower chest: Nodular ground-glass airspace opacity within the  posterior basal left lower lobe measuring 3 cm. No pleural effusion. Hepatobiliary: No mass. No radiopaque stones or wall thickening of the gallbladder. No intrahepatic or extrahepatic biliary ductal dilation. Pancreas: No mass or main ductal dilation. No peripancreatic inflammation or fluid collection. Spleen: Normal size. No mass. Adrenals/Urinary Tract: No adrenal masses. No renal mass. The left kidney appears mildly edematous with perinephric stranding. No hydronephrosis or nephrolithiasis. Decompressed urinary bladder without visualized focal abnormality. Stomach/Bowel: The stomach is decompressed without focal abnormality. No small bowel wall thickening or inflammation. No small bowel obstruction. Normal appendix. Vascular/Lymphatic: No aortic aneurysm. No intraabdominal  or pelvic lymphadenopathy. Reproductive: The uterus and ovaries are within normal limits for patient's age. No free pelvic fluid. Other: No pneumoperitoneum, ascites, or mesenteric inflammation. Musculoskeletal: No acute fracture or destructive lesion. Multilevel thoracic osteophytosis. Partially visualized bilateral breast implants. IMPRESSION: 1. Left perinephric and periureteric stranding, which may be due to a recently passed calculus or an ascending urinary tract infection. Correlation with urinalysis recommended. 2. Nodular ground-glass airspace opacity within the posterobasal left lower lobe, measuring 3 cm. While this is likely infectious or inflammatory continued follow-up is recommended, as documented below. Initial follow-up with CT at 6 months is recommended to confirm persistence. If persistent, repeat CT is recommended every 2 years until 5 years of stability has been established. This recommendation follows the consensus statement: Guidelines for Management of Incidental Pulmonary Nodules Detected on CT Images: From the Fleischner Society 2017; Radiology 2017; 284:228-243. Electronically Signed   By: Rance Burrows M.D.   On:  11/18/2023 14:53    Procedures Procedures    Medications Ordered in ED Medications  morphine  (PF) 2 MG/ML injection 2 mg (2 mg Intravenous Given 11/18/23 1329)  ondansetron  (ZOFRAN ) injection 4 mg (4 mg Intravenous Given 11/18/23 1331)  morphine  (PF) 2 MG/ML injection 2 mg (2 mg Intravenous Given 11/18/23 1413)  cefTRIAXone  (ROCEPHIN ) 1 g in sodium chloride  0.9 % 100 mL IVPB (0 g Intravenous Stopped 11/18/23 1450)    ED Course/ Medical Decision Making/ A&P                                 Medical Decision Making Amount and/or Complexity of Data Reviewed Labs: ordered. Radiology: ordered.   This patient presents to the ED for concern of dysuria differential diagnosis includes UTI, pyelonephritis, kidney stone    Additional history obtained:  External records from outside source obtained and reviewed including Care Everywhere   Lab Tests:  I Ordered, and personally interpreted labs.  The pertinent results include: Negative urine pregnancy, UA with trace hemoglobin, small leukocytes, nitrite positive, many bacteria, 6-10 squamous epithelial, 11-20 WBCs, leukocytosis at 17.2,    Imaging Studies ordered:  I ordered imaging studies including CT renal stone study I independently visualized and interpreted imaging which showed left perinephric and periureteral stranding.  Nodular groundglass airspace opacity within the posterior basilar left lower lobe.  Follow-up CT in 6 months recommended. I agree with the radiologist interpretation   Medicines ordered and prescription drug management:  I ordered medication including morphine  and Zofran  for pain and nausea 1 g Rocephin  for UTI Reevaluation of the patient after these medicines showed that the patient improved I have reviewed the patients home medicines and have made adjustments as needed   Problem List / ED Course:  Consider for admission or further workup however patient's vital signs, physical exam, labs, and imaging  were reassuring.  Patient given first dose of antibiotic while in ED.  Patient given outpatient course of cefpodoxime  for outpatient treatment for pyelonephritis.  Patient also given short course of Percocet for pain and Phenergan  for nausea.  Patient given return precautions.           Final Clinical Impression(s) / ED Diagnoses Final diagnoses:  Pyelonephritis    Rx / DC Orders ED Discharge Orders          Ordered    cefpodoxime  (VANTIN ) 200 MG tablet  2 times daily        11/18/23 1513    promethazine  (  PHENERGAN ) 25 MG tablet  Every 6 hours PRN        11/18/23 1513    oxyCODONE -acetaminophen  (PERCOCET/ROXICET) 5-325 MG tablet  Every 8 hours PRN        11/18/23 1513              Carie Charity, PA-C 11/18/23 1514    Mordecai Applebaum, MD 11/19/23 [phone]

## 2023-11-18 NOTE — ED Notes (Signed)
 States dad is coming to get her

## 2023-11-18 NOTE — ED Notes (Signed)
 Pt. Reports for 3 days she has been urinating a lot and last night she started vomiting.  Pt. Has back pain and has had kidney stones in past.

## 2023-11-18 NOTE — Discharge Instructions (Addendum)
 You were seen for pyelonephritis.  Please pick up your medications and take as prescribed.  Please return to the ED if you have uncontrollable vomiting, your pain worsens, or you have uncontrolled fever.  Thank you for letting us  treat you today. After reviewing your labs and imaging, I feel you are safe to go home. Please follow up with your PCP in the next several days and provide them with your records from this visit. Return to the Emergency Room if pain becomes severe or symptoms worsen.

## 2023-11-18 NOTE — ED Triage Notes (Signed)
 Dysuria x 3 days hx of kidney stones  left lower abd and back pain vomiting also

## 2023-11-19 ENCOUNTER — Other Ambulatory Visit: Payer: Self-pay

## 2023-11-19 ENCOUNTER — Observation Stay (HOSPITAL_BASED_OUTPATIENT_CLINIC_OR_DEPARTMENT_OTHER)
Admission: EM | Admit: 2023-11-19 | Discharge: 2023-11-19 | Discharge status: Against Medical Advice | Attending: Internal Medicine | Admitting: Internal Medicine

## 2023-11-19 ENCOUNTER — Encounter (HOSPITAL_BASED_OUTPATIENT_CLINIC_OR_DEPARTMENT_OTHER): Payer: Self-pay | Admitting: Emergency Medicine

## 2023-11-19 DIAGNOSIS — Z79899 Other long term (current) drug therapy: Secondary | ICD-10-CM | POA: Insufficient documentation

## 2023-11-19 DIAGNOSIS — I10 Essential (primary) hypertension: Secondary | ICD-10-CM | POA: Insufficient documentation

## 2023-11-19 DIAGNOSIS — F17210 Nicotine dependence, cigarettes, uncomplicated: Secondary | ICD-10-CM | POA: Insufficient documentation

## 2023-11-19 DIAGNOSIS — F418 Other specified anxiety disorders: Secondary | ICD-10-CM | POA: Diagnosis present

## 2023-11-19 DIAGNOSIS — N12 Tubulo-interstitial nephritis, not specified as acute or chronic: Principal | ICD-10-CM | POA: Diagnosis present

## 2023-11-19 DIAGNOSIS — F1111 Opioid abuse, in remission: Secondary | ICD-10-CM | POA: Diagnosis present

## 2023-11-19 DIAGNOSIS — F32A Depression, unspecified: Secondary | ICD-10-CM | POA: Insufficient documentation

## 2023-11-19 DIAGNOSIS — N10 Acute tubulo-interstitial nephritis: Principal | ICD-10-CM | POA: Insufficient documentation

## 2023-11-19 DIAGNOSIS — F419 Anxiety disorder, unspecified: Secondary | ICD-10-CM | POA: Insufficient documentation

## 2023-11-19 DIAGNOSIS — R739 Hyperglycemia, unspecified: Secondary | ICD-10-CM

## 2023-11-19 DIAGNOSIS — O139 Gestational [pregnancy-induced] hypertension without significant proteinuria, unspecified trimester: Secondary | ICD-10-CM | POA: Diagnosis present

## 2023-11-19 DIAGNOSIS — E1165 Type 2 diabetes mellitus with hyperglycemia: Secondary | ICD-10-CM | POA: Diagnosis not present

## 2023-11-19 LAB — CBC WITH DIFFERENTIAL/PLATELET
Abs Immature Granulocytes: 0.06 10*3/uL (ref 0.00–0.07)
Basophils Absolute: 0 10*3/uL (ref 0.0–0.1)
Basophils Relative: 0 %
Eosinophils Absolute: 0 10*3/uL (ref 0.0–0.5)
Eosinophils Relative: 0 %
HCT: 33.8 % — ABNORMAL LOW (ref 36.0–46.0)
Hemoglobin: 11.9 g/dL — ABNORMAL LOW (ref 12.0–15.0)
Immature Granulocytes: 1 %
Lymphocytes Relative: 5 %
Lymphs Abs: 0.6 10*3/uL — ABNORMAL LOW (ref 0.7–4.0)
MCH: 34.1 pg — ABNORMAL HIGH (ref 26.0–34.0)
MCHC: 35.2 g/dL (ref 30.0–36.0)
MCV: 96.8 fL (ref 80.0–100.0)
Monocytes Absolute: 1 10*3/uL (ref 0.1–1.0)
Monocytes Relative: 8 %
Neutro Abs: 10.2 10*3/uL — ABNORMAL HIGH (ref 1.7–7.7)
Neutrophils Relative %: 86 %
Platelets: 198 10*3/uL (ref 150–400)
RBC: 3.49 MIL/uL — ABNORMAL LOW (ref 3.87–5.11)
RDW: 13 % (ref 11.5–15.5)
WBC: 11.9 10*3/uL — ABNORMAL HIGH (ref 4.0–10.5)
nRBC: 0 % (ref 0.0–0.2)

## 2023-11-19 LAB — BASIC METABOLIC PANEL WITH GFR
Anion gap: 18 — ABNORMAL HIGH (ref 5–15)
BUN: 12 mg/dL (ref 6–20)
CO2: 19 mmol/L — ABNORMAL LOW (ref 22–32)
Calcium: 9.2 mg/dL (ref 8.9–10.3)
Chloride: 96 mmol/L — ABNORMAL LOW (ref 98–111)
Creatinine, Ser: 0.68 mg/dL (ref 0.44–1.00)
GFR, Estimated: >60 mL/min (ref >60)
Glucose, Bld: 228 mg/dL — ABNORMAL HIGH (ref 70–99)
Potassium: 3.4 mmol/L — ABNORMAL LOW (ref 3.5–5.1)
Sodium: 133 mmol/L — ABNORMAL LOW (ref 135–145)

## 2023-11-19 LAB — URINALYSIS, ROUTINE W REFLEX MICROSCOPIC
Bilirubin Urine: NEGATIVE
Glucose, UA: >=500 mg/dL — AB
Ketones, ur: NEGATIVE mg/dL
Leukocytes,Ua: NEGATIVE
Nitrite: NEGATIVE
Protein, ur: 100 mg/dL — AB
Specific Gravity, Urine: 1.025 (ref 1.005–1.030)
pH: 6 (ref 5.0–8.0)

## 2023-11-19 LAB — URINE DRUG SCREEN
Amphetamines: NOT DETECTED
Barbiturates: NOT DETECTED
Benzodiazepines: DETECTED — AB
Cocaine: NOT DETECTED
Fentanyl: DETECTED — AB
Methadone Scn, Ur: NOT DETECTED
Opiates: DETECTED — AB
Tetrahydrocannabinol: NOT DETECTED

## 2023-11-19 LAB — URINALYSIS, MICROSCOPIC (REFLEX)

## 2023-11-19 MED ORDER — GUAIFENESIN ER 600 MG PO TB12: 600.0000 mg | ORAL_TABLET | Freq: Two times a day (BID) | ORAL | Status: DC | PRN

## 2023-11-19 MED ORDER — ALPRAZOLAM 0.5 MG PO TABS
0.5000 mg | ORAL_TABLET | Freq: Every day | ORAL | Status: DC | PRN
  Administered 2023-11-19: 0.5 mg via ORAL
  Filled 2023-11-19: qty 1

## 2023-11-19 MED ORDER — ACETAMINOPHEN 325 MG PO TABS
650.0000 mg | ORAL_TABLET | Freq: Four times a day (QID) | ORAL | Status: DC | PRN
  Administered 2023-11-19: 650 mg via ORAL
  Filled 2023-11-19: qty 2

## 2023-11-19 MED ORDER — MORPHINE SULFATE (PF) 2 MG/ML IV SOLN
2.0000 mg | Freq: Once | INTRAVENOUS | Status: AC
  Administered 2023-11-19: 2 mg via INTRAVENOUS
  Filled 2023-11-19: qty 1

## 2023-11-19 MED ORDER — SODIUM CHLORIDE 0.9 % IV SOLN
2.0000 g | Freq: Once | INTRAVENOUS | Status: AC
  Administered 2023-11-19: 2 g via INTRAVENOUS
  Filled 2023-11-19: qty 20

## 2023-11-19 MED ORDER — ACETAMINOPHEN 650 MG RE SUPP
650.0000 mg | Freq: Four times a day (QID) | RECTAL | Status: DC | PRN
Start: 2023-11-19 — End: 2023-11-19

## 2023-11-19 MED ORDER — IRBESARTAN 75 MG PO TABS
75.0000 mg | ORAL_TABLET | Freq: Every day | ORAL | Status: DC
  Filled 2023-11-19: qty 1

## 2023-11-19 MED ORDER — BISACODYL 5 MG PO TBEC: 5.0000 mg | DELAYED_RELEASE_TABLET | Freq: Every day | ORAL | Status: DC | PRN

## 2023-11-19 MED ORDER — OXYCODONE HCL 5 MG PO TABS: 5.0000 mg | ORAL_TABLET | ORAL | Status: DC | PRN

## 2023-11-19 MED ORDER — ENOXAPARIN SODIUM 40 MG/0.4ML IJ SOSY
40.0000 mg | PREFILLED_SYRINGE | INTRAMUSCULAR | Status: DC
  Filled 2023-11-19: qty 0.4

## 2023-11-19 MED ORDER — ONDANSETRON HCL 4 MG/2ML IJ SOLN
4.0000 mg | Freq: Once | INTRAMUSCULAR | Status: AC
  Administered 2023-11-19: 4 mg via INTRAVENOUS
  Filled 2023-11-19: qty 2

## 2023-11-19 MED ORDER — ONDANSETRON HCL 4 MG/2ML IJ SOLN
4.0000 mg | Freq: Four times a day (QID) | INTRAMUSCULAR | Status: DC | PRN
  Filled 2023-11-19: qty 2

## 2023-11-19 MED ORDER — SODIUM CHLORIDE 0.9 % IV SOLN
INTRAVENOUS | Status: DC
Start: 2023-11-19 — End: 2023-11-19

## 2023-11-19 MED ORDER — HYDROMORPHONE HCL 1 MG/ML IJ SOLN
0.5000 mg | INTRAMUSCULAR | Status: DC | PRN
  Administered 2023-11-19: 1 mg via INTRAVENOUS
  Filled 2023-11-19: qty 1

## 2023-11-19 MED ORDER — CLONIDINE HCL 0.1 MG PO TABS
0.1000 mg | ORAL_TABLET | Freq: Two times a day (BID) | ORAL | Status: DC
  Filled 2023-11-19: qty 1

## 2023-11-19 MED ORDER — ONDANSETRON HCL 4 MG PO TABS: 4.0000 mg | ORAL_TABLET | Freq: Four times a day (QID) | ORAL | Status: DC | PRN

## 2023-11-19 MED ORDER — DROPERIDOL 2.5 MG/ML IJ SOLN
2.5000 mg | Freq: Once | INTRAMUSCULAR | Status: AC
  Administered 2023-11-19: 2.5 mg via INTRAVENOUS
  Filled 2023-11-19: qty 2

## 2023-11-19 MED ORDER — ACETAMINOPHEN 325 MG PO TABS: 650.0000 mg | ORAL_TABLET | Freq: Four times a day (QID) | ORAL | Status: DC | PRN

## 2023-11-19 MED ORDER — NICOTINE 21 MG/24HR TD PT24
21.0000 mg | MEDICATED_PATCH | Freq: Every day | TRANSDERMAL | Status: DC | PRN
  Administered 2023-11-19: 21 mg via TRANSDERMAL
  Filled 2023-11-19: qty 1

## 2023-11-19 MED ORDER — SODIUM CHLORIDE 0.9 % IV SOLN: 1.0000 g | Freq: Once | INTRAVENOUS | Status: DC

## 2023-11-19 MED ORDER — SODIUM CHLORIDE 0.9 % IV SOLN: 1.0000 g | INTRAVENOUS | Status: DC

## 2023-11-19 MED ORDER — METOPROLOL SUCCINATE ER 25 MG PO TB24
25.0000 mg | ORAL_TABLET | Freq: Every day | ORAL | Status: DC
  Filled 2023-11-19: qty 1

## 2023-11-19 MED ORDER — BUSPIRONE HCL 10 MG PO TABS
30.0000 mg | ORAL_TABLET | Freq: Two times a day (BID) | ORAL | Status: DC
  Filled 2023-11-19: qty 3

## 2023-11-19 MED ORDER — FLEET ENEMA RE ENEM: 1.0000 | ENEMA | Freq: Once | RECTAL | Status: DC | PRN

## 2023-11-19 MED ORDER — MORPHINE SULFATE (PF) 4 MG/ML IV SOLN
4.0000 mg | INTRAVENOUS | Status: AC | PRN
  Administered 2023-11-19 (×2): 4 mg via INTRAVENOUS
  Filled 2023-11-19 (×2): qty 1

## 2023-11-19 MED ORDER — HYDRALAZINE HCL 20 MG/ML IJ SOLN: 10.0000 mg | INTRAMUSCULAR | Status: DC | PRN

## 2023-11-19 MED ORDER — OXYCODONE-ACETAMINOPHEN 5-325 MG PO TABS
1.0000 | ORAL_TABLET | Freq: Three times a day (TID) | ORAL | Status: DC | PRN
  Filled 2023-11-19: qty 1

## 2023-11-19 MED ORDER — ALBUTEROL SULFATE (2.5 MG/3ML) 0.083% IN NEBU: 2.5000 mg | INHALATION_SOLUTION | RESPIRATORY_TRACT | Status: DC | PRN

## 2023-11-19 MED ORDER — MORPHINE SULFATE (PF) 4 MG/ML IV SOLN: 4.0000 mg | Freq: Once | INTRAVENOUS | Status: DC

## 2023-11-19 MED ORDER — FENTANYL CITRATE PF 50 MCG/ML IJ SOSY
50.0000 ug | PREFILLED_SYRINGE | Freq: Once | INTRAMUSCULAR | Status: AC
  Administered 2023-11-19: 50 ug via INTRAVENOUS
  Filled 2023-11-19: qty 1

## 2023-11-19 MED ORDER — SODIUM CHLORIDE 0.9 % IV BOLUS
1000.0000 mL | Freq: Once | INTRAVENOUS | Status: AC
  Administered 2023-11-19: 1000 mL via INTRAVENOUS

## 2023-11-19 MED ORDER — SENNOSIDES-DOCUSATE SODIUM 8.6-50 MG PO TABS: 1.0000 | ORAL_TABLET | Freq: Every evening | ORAL | Status: DC | PRN

## 2023-11-19 MED ORDER — METOPROLOL TARTRATE 5 MG/5ML IV SOLN: 5.0000 mg | INTRAVENOUS | Status: DC | PRN

## 2023-11-19 MED ORDER — SERTRALINE HCL 100 MG PO TABS
200.0000 mg | ORAL_TABLET | Freq: Every day | ORAL | Status: DC
  Filled 2023-11-19: qty 2

## 2023-11-19 NOTE — Progress Notes (Signed)
 Hospitalist Transfer Note:    Nursing staff, Please call [facility] Admits & Consults System-Wide number on Amion [phone]) as soon as patient's arrival, so appropriate admitting provider can evaluate the pt.   Transferring facility: [facility] Requesting provider: Dr. Palumbo (EDP at [facility]) Reason for transfer: admission for further evaluation and management of left-sided pyelonephritis.     39 year old female with history of polysubstance abuse, who presented to [facility] ED initially on 11/18/2023 complaining of left-sided flank discomfort associate with nausea/vomiting.  She was diagnosed with left-sided pyelonephritis with evidence of left-sided perinephric stranding identified at that time.  She was discharged to home from the emergency department on 11/18/2023 on oral antibiotics as well as prn Phenergan .  However, she returns to [facility] this evening complaining of progressive left-sided flank discomfort, unable to achieve adequate pain control with home Percocet, and also unable to tolerate p.o. due to refractory nausea/vomiting in spite of outpatient Phenergan .  She reports that she has been unable to keep down her antibiotic in the interval since being discharged from [facility]'s emergency department.  Subsequently, I accepted this patient for transfer for /inpatient admission to a med-surg bed at [facility] or [facility]  (first available) for further work-up and management of the above.     Camelia Cavalier, DO Hospitalist

## 2023-11-19 NOTE — ED Provider Notes (Addendum)
 [place] [facility] AT [facility] Provider Note   CSN: [id number] Arrival date & time: 11/19/23  0357     History  Chief Complaint  Patient presents with   Emesis    [name] is a 38 y.o. female.  The history is provided by the patient.  Emesis Severity:  Severe Duration:  1 day Timing:  Intermittent Quality:  Stomach contents Progression:  Unchanged Chronicity:  New Recent urination:  Normal Context: not post-tussive   Relieved by:  Nothing Worsened by:  Nothing Ineffective treatments:  None tried Associated symptoms: abdominal pain   Associated symptoms: no fever   Associated symptoms comment:  Dysuria and frequency and flank pain  Patient with a history of anxiety and depression who was seen approximately 18 hours ago and diagnosed with  pyelonephritis and started on antibiotics in addition to percocet and phenergan  presents with worsening symptoms and inability to tolerate medication or liquids and having intractable pain.      Past Medical History:  Diagnosis Date   Anxiety    Depression    Dysuria    Family history of adverse reaction to anesthesia    mother-- hard to wake   Frequency of urination    History of kidney stones    2010;   Hypertension    Insomnia    Left ureteral stone    PONV (postoperative nausea and vomiting)    Retained ureteral stent    left side--- placed 11-28-2016   Urgency of urination      Home Medications Prior to Admission medications   Medication Sig Start Date End Date Taking? Authorizing Provider  acetaminophen  (TYLENOL ) 500 MG tablet Take 1 tablet (500 mg total) by mouth every 6 (six) hours as needed. 01/15/18   Law, Darnell Elbe, PA-C  amLODipine  (NORVASC ) 5 MG tablet Take 1 tablet (5 mg total) by mouth daily. Patient taking differently: Take 5 mg by mouth every morning.  10/13/17   Moss, Amber, DO  cefpodoxime  (VANTIN ) 200 MG tablet Take 1 tablet (200 mg total) by mouth 2 (two) times daily.  11/18/23   Keith, Kayla N, PA-C  ibuprofen  (ADVIL ,MOTRIN ) 800 MG tablet Take 1 tablet (800 mg total) by mouth 3 (three) times daily. 01/15/18   Law, Darnell Elbe, PA-C  methocarbamol  (ROBAXIN ) 500 MG tablet Take 1 tablet (500 mg total) by mouth 2 (two) times daily. 01/15/18   Law, Alexandra M, PA-C  oxybutynin  (DITROPAN ) 5 MG tablet Take 5 mg by mouth 3 (three) times daily.    [provider]  oxyCODONE -acetaminophen  (PERCOCET/ROXICET) 5-325 MG tablet Take 1 tablet by mouth every 8 (eight) hours as needed for up to 3 days for severe pain (pain score 7-10). 11/18/23 11/21/23  Carie Charity, PA-C  promethazine  (PHENERGAN ) 25 MG tablet Take 1 tablet (25 mg total) by mouth every 6 (six) hours as needed for nausea or vomiting. 11/18/23   Keith, Kayla N, PA-C  traMADol  (ULTRAM ) 50 MG tablet Take 1 tablet (50 mg total) by mouth every 6 (six) hours as needed. 12/25/17   Trent Frizzle, MD      Allergies    Ketorolac , Amoxicillin, Penicillins, and Sulfa antibiotics    Review of Systems   Review of Systems  Constitutional:  Negative for fever.  Respiratory:  Negative for wheezing and stridor.   Gastrointestinal:  Positive for abdominal pain, nausea and vomiting.  Genitourinary:  Positive for dysuria, flank pain and frequency.  All other systems reviewed and are negative.  Physical Exam Updated Vital Signs BP 126/85 (BP Location: Right Arm)   Pulse 99   Temp 98.5 F (36.9 C) (Oral)   Resp 20   Ht 5\' 3"  (1.6 m)   Wt 78.9 kg   SpO2 99%   BMI 30.81 kg/m  Physical Exam Vitals and nursing note reviewed.  Constitutional:      General: She is not in acute distress.    Appearance: Normal appearance. She is well-developed.  HENT:     Head: Normocephalic and atraumatic.     Nose: Nose normal.  Eyes:     Pupils: Pupils are equal, round, and reactive to light.  Cardiovascular:     Rate and Rhythm: Normal rate and regular rhythm.     Pulses: Normal pulses.     Heart sounds: Normal heart  sounds.  Pulmonary:     Effort: Pulmonary effort is normal. No respiratory distress.     Breath sounds: Normal breath sounds.  Abdominal:     General: Bowel sounds are normal. There is no distension.     Palpations: Abdomen is soft.     Tenderness: There is no abdominal tenderness. There is no guarding or rebound.  Musculoskeletal:        General: Normal range of motion.     Cervical back: Normal range of motion and neck supple.  Skin:    General: Skin is warm and dry.     Capillary Refill: Capillary refill takes less than 2 seconds.     Findings: No erythema or rash.  Neurological:     General: No focal deficit present.     Mental Status: She is alert and oriented to person, place, and time.     Deep Tendon Reflexes: Reflexes normal.  Psychiatric:        Mood and Affect: Mood normal.     ED Results / Procedures / Treatments   Labs (all labs ordered are listed, but only abnormal results are displayed) Results for orders placed or performed during the hospital encounter of 11/19/23  CBC with Differential   Collection Time: 11/19/23  4:50 AM  Result Value Ref Range   WBC 11.9 (H) 4.0 - 10.5 K/uL   RBC 3.49 (L) 3.87 - 5.11 MIL/uL   Hemoglobin 11.9 (L) 12.0 - 15.0 g/dL   HCT 69.6 (L) 29.5 - 28.4 %   MCV 96.8 80.0 - 100.0 fL   MCH 34.1 (H) 26.0 - 34.0 pg   MCHC 35.2 30.0 - 36.0 g/dL   RDW 13.2 44.0 - 10.2 %   Platelets 198 150 - 400 K/uL   nRBC 0.0 0.0 - 0.2 %   Neutrophils Relative % 86 %   Neutro Abs 10.2 (H) 1.7 - 7.7 K/uL   Lymphocytes Relative 5 %   Lymphs Abs 0.6 (L) 0.7 - 4.0 K/uL   Monocytes Relative 8 %   Monocytes Absolute 1.0 0.1 - 1.0 K/uL   Eosinophils Relative 0 %   Eosinophils Absolute 0.0 0.0 - 0.5 K/uL   Basophils Relative 0 %   Basophils Absolute 0.0 0.0 - 0.1 K/uL   Immature Granulocytes 1 %   Abs Immature Granulocytes 0.06 0.00 - 0.07 K/uL  Basic metabolic panel   Collection Time: 11/19/23  4:50 AM  Result Value Ref Range   Sodium 133 (L) 135 -  145 mmol/L   Potassium 3.4 (L) 3.5 - 5.1 mmol/L   Chloride 96 (L) 98 - 111 mmol/L   CO2 19 (L) 22 - 32 mmol/L  Glucose, Bld 228 (H) 70 - 99 mg/dL   BUN 12 6 - 20 mg/dL   Creatinine, Ser 1.61 0.44 - 1.00 mg/dL   Calcium 9.2 8.9 - 09.6 mg/dL   GFR, Estimated >04 >54 mL/min   Anion gap 18 (H) 5 - 15   CT Renal Stone Study Result Date: 11/18/2023 CLINICAL DATA:  Abdominal/flank pain, stone suspected EXAM: CT ABDOMEN AND PELVIS WITHOUT CONTRAST TECHNIQUE: Multidetector CT imaging of the abdomen and pelvis was performed following the standard protocol without IV contrast. RADIATION DOSE REDUCTION: This exam was performed according to the departmental dose-optimization program which includes automated exposure control, adjustment of the mA and/or kV according to patient size and/or use of iterative reconstruction technique. COMPARISON:  Nov 28, 2016 FINDINGS: Of note, the lack of intravenous contrast limits evaluation of the solid organ parenchyma and vascularity. Lower chest: Nodular ground-glass airspace opacity within the posterior basal left lower lobe measuring 3 cm. No pleural effusion. Hepatobiliary: No mass. No radiopaque stones or wall thickening of the gallbladder. No intrahepatic or extrahepatic biliary ductal dilation. Pancreas: No mass or main ductal dilation. No peripancreatic inflammation or fluid collection. Spleen: Normal size. No mass. Adrenals/Urinary Tract: No adrenal masses. No renal mass. The left kidney appears mildly edematous with perinephric stranding. No hydronephrosis or nephrolithiasis. Decompressed urinary bladder without visualized focal abnormality. Stomach/Bowel: The stomach is decompressed without focal abnormality. No small bowel wall thickening or inflammation. No small bowel obstruction. Normal appendix. Vascular/Lymphatic: No aortic aneurysm. No intraabdominal or pelvic lymphadenopathy. Reproductive: The uterus and ovaries are within normal limits for patient's age. No free  pelvic fluid. Other: No pneumoperitoneum, ascites, or mesenteric inflammation. Musculoskeletal: No acute fracture or destructive lesion. Multilevel thoracic osteophytosis. Partially visualized bilateral breast implants. IMPRESSION: 1. Left perinephric and periureteric stranding, which may be due to a recently passed calculus or an ascending urinary tract infection. Correlation with urinalysis recommended. 2. Nodular ground-glass airspace opacity within the posterobasal left lower lobe, measuring 3 cm. While this is likely infectious or inflammatory continued follow-up is recommended, as documented below. Initial follow-up with CT at 6 months is recommended to confirm persistence. If persistent, repeat CT is recommended every 2 years until 5 years of stability has been established. This recommendation follows the consensus statement: Guidelines for Management of Incidental Pulmonary Nodules Detected on CT Images: From the Fleischner Society 2017; Radiology 2017; 284:228-243. Electronically Signed   By: Rance Burrows M.D.   On: 11/18/2023 14:53   gy CT Renal Stone Study Result Date: 11/18/2023 CLINICAL DATA:  Abdominal/flank pain, stone suspected EXAM: CT ABDOMEN AND PELVIS WITHOUT CONTRAST TECHNIQUE: Multidetector CT imaging of the abdomen and pelvis was performed following the standard protocol without IV contrast. RADIATION DOSE REDUCTION: This exam was performed according to the departmental dose-optimization program which includes automated exposure control, adjustment of the mA and/or kV according to patient size and/or use of iterative reconstruction technique. COMPARISON:  Nov 28, 2016 FINDINGS: Of note, the lack of intravenous contrast limits evaluation of the solid organ parenchyma and vascularity. Lower chest: Nodular ground-glass airspace opacity within the posterior basal left lower lobe measuring 3 cm. No pleural effusion. Hepatobiliary: No mass. No radiopaque stones or wall thickening of the  gallbladder. No intrahepatic or extrahepatic biliary ductal dilation. Pancreas: No mass or main ductal dilation. No peripancreatic inflammation or fluid collection. Spleen: Normal size. No mass. Adrenals/Urinary Tract: No adrenal masses. No renal mass. The left kidney appears mildly edematous with perinephric stranding. No hydronephrosis or nephrolithiasis. Decompressed urinary bladder  without visualized focal abnormality. Stomach/Bowel: The stomach is decompressed without focal abnormality. No small bowel wall thickening or inflammation. No small bowel obstruction. Normal appendix. Vascular/Lymphatic: No aortic aneurysm. No intraabdominal or pelvic lymphadenopathy. Reproductive: The uterus and ovaries are within normal limits for patient's age. No free pelvic fluid. Other: No pneumoperitoneum, ascites, or mesenteric inflammation. Musculoskeletal: No acute fracture or destructive lesion. Multilevel thoracic osteophytosis. Partially visualized bilateral breast implants. IMPRESSION: 1. Left perinephric and periureteric stranding, which may be due to a recently passed calculus or an ascending urinary tract infection. Correlation with urinalysis recommended. 2. Nodular ground-glass airspace opacity within the posterobasal left lower lobe, measuring 3 cm. While this is likely infectious or inflammatory continued follow-up is recommended, as documented below. Initial follow-up with CT at 6 months is recommended to confirm persistence. If persistent, repeat CT is recommended every 2 years until 5 years of stability has been established. This recommendation follows the consensus statement: Guidelines for Management of Incidental Pulmonary Nodules Detected on CT Images: From the Fleischner Society 2017; Radiology 2017; 284:228-243. Electronically Signed   By: Rance Burrows M.D.   On: 11/18/2023 14:53    Procedures Procedures    Medications Ordered in ED Medications  cefTRIAXone  (ROCEPHIN ) 2 g in sodium chloride   0.9 % 100 mL IVPB (2 g Intravenous New Bag/Given 11/19/23 0506)  sodium chloride  0.9 % bolus 1,000 mL (1,000 mLs Intravenous New Bag/Given 11/19/23 0500)  droperidol  (INAPSINE ) 2.5 MG/ML injection 2.5 mg (2.5 mg Intravenous Given 11/19/23 0456)    ED Course/ Medical Decision Making/ A&P                                 Medical Decision Making Patient with flank pain and dysuria and emesis   Amount and/or Complexity of Data Reviewed Labs: ordered.    Details: White count elevated 11.9, hemoglobin slight low 11.9, normal platelets.  Sodium slight low 133, potassium slight low 3.4, normal creatinine.  Pregnancy was negative earlier.    Risk Prescription drug management. Parenteral controlled substances. Decision regarding hospitalization.    Final Clinical Impression(s) / ED Diagnoses Final diagnoses:  Pyelonephritis   The patient appears reasonably stabilized for admission considering the current resources, flow, and capabilities available in the ED at this time, and I doubt any other [facility] requiring further screening and/or treatment in the ED prior to admission.  Rx / DC Orders ED Discharge Orders     None          Alonah Lineback, MD 11/19/23 [phone]

## 2023-11-19 NOTE — Progress Notes (Addendum)
 Patient leaving AMA advised of the risks still wanting to leave IV taken out. Patient walked to main entrance. MD aware. AMA form signed.

## 2023-11-19 NOTE — H&P (Signed)
 History and Physical    [name] ZOX:[id number] DOB: 04-11-1986 DOA: 11/19/2023  PCP: Mitchell Ana, PA-C Patient coming from: Home  Chief Complaint: Home  HPI: [name] is a 38 y.o. female with medical history significant of polysubstance abuse comes to the ER with complaints of left-sided flank pain.  Patient initially presented to [facility] ED on 4/22 with complaints of left-sided flank pain.  She was diagnosed with left-sided pyelonephritis and sent home on p.o. antibiotics.  After going home she continued to have left flank pain which was worsening therefore came back to the hospital.  CT scan was consistent with left-sided pyelonephritis.  Lab work showed leukocytosis and hypokalemia.  Patient started on IV antibiotics and admitted to the hospital   Review of Systems: As per HPI otherwise 10 point review of systems negative.  Review of Systems Otherwise negative except as per HPI, including: General: Denies fever, chills, night sweats or unintended weight loss. Resp: Denies cough, wheezing, shortness of breath. Cardiac: Denies chest pain, palpitations, orthopnea, paroxysmal nocturnal dyspnea. GI: Denies abdominal pain, nausea, vomiting, diarrhea or constipation GU: Left-sided flank pain with some dysuria MS: Denies muscle aches, joint pain or swelling Neuro: Denies headache, neurologic deficits (focal weakness, numbness, tingling), abnormal gait Psych: Denies anxiety, depression, SI/HI/AVH Skin: Denies new rashes or lesions ID: Denies sick contacts, exotic exposures, travel  Past Medical History:  Diagnosis Date   Anxiety    Depression    Dysuria    Family history of adverse reaction to anesthesia    mother-- hard to wake   Frequency of urination    History of kidney stones    2010;   Hypertension    Insomnia    Left ureteral stone    PONV (postoperative nausea and vomiting)    Retained ureteral stent    left side--- placed 11-28-2016   Urgency of urination      Past Surgical History:  Procedure Laterality Date   BREAST ENHANCEMENT SURGERY Bilateral 2008   CYSTOSCOPY W/ URETERAL STENT PLACEMENT  11/28/2016   Procedure: CYSTOSCOPY WITH LEFT RETROGRADE PYELOGRAM/LEFT URETERAL STENT PLACEMENT;  Surgeon: Trent Frizzle, MD;  Location: [facility];  Service: Urology;;   CYSTOSCOPY W/ URETERAL STENT REMOVAL Left 12/25/2017   Procedure: CYSTOSCOPY WITH STENT REMOVAL;  Surgeon: Trent Frizzle, MD;  Location: [facility];  Service: Urology;  Laterality: Left;   CYSTOSCOPY WITH RETROGRADE PYELOGRAM, URETEROSCOPY AND STENT PLACEMENT Left 12/25/2017   Procedure: CYSTOSCOPY, URETEROSCOPY STONE BASKETRY;  Surgeon: Trent Frizzle, MD;  Location: [facility];  Service: Urology;  Laterality: Left;   WISDOM TOOTH EXTRACTION      SOCIAL HISTORY:  reports that she has been smoking cigarettes. She has a 3 pack-year smoking history. She has never used smokeless tobacco. She reports that she does not currently use drugs. She reports that she does not drink alcohol.  Allergies  Allergen Reactions   Clonazepam Other (See Comments)    Dizziness, Mental Status Changes, Irritability     Lorazepam Other (See Comments)    Dizziness, Mental Status Changes, Irritability   Penicillins Dermatitis, Rash and Other (See Comments)    Tolerated ceftriaxone  prior Has patient had a PCN reaction causing immediate rash, facial/tongue/throat swelling, SOB or lightheadedness with hypotension: Yes Has patient had a PCN reaction causing severe rash involving mucus membranes or skin necrosis: No Has patient had a PCN reaction that required hospitalization: No Has patient had a PCN reaction occurring within the last 10 years:  No If all of the above answers are "NO", then may proceed with Cephalosporin use.    Amoxicillin Rash and Other (See Comments)    Has patient had a PCN reaction causing immediate rash, facial/tongue/throat swelling, SOB or  lightheadedness with hypotension: Yes Has patient had a PCN reaction causing severe rash involving mucus membranes or skin necrosis: No Has patient had a PCN reaction that required hospitalization: No Has patient had a PCN reaction occurring within the last 10 years: No If all of the above answers are "NO", then may proceed with Cephalosporin use.    Amoxicillin-Pot Clavulanate Dermatitis   Ketorolac  Rash and Other (See Comments)    Made blood pressure get high per pt   Sulfa Antibiotics Dermatitis and Rash    FAMILY HISTORY: Family History  Problem Relation Age of Onset   Hypertension Mother    Depression Mother    Hyperlipidemia Father    Hypertension Father    ADD / ADHD Sister    Depression Sister    Stroke Sister    Hypertension Sister    ADD / ADHD Brother    Hypertension Brother    Stroke Maternal Grandmother    Hypertension Maternal Grandmother    Kidney failure Paternal Grandmother      Prior to Admission medications   Medication Sig Start Date End Date Taking? Authorizing Provider  albuterol  (VENTOLIN  HFA) 108 (90 Base) MCG/ACT inhaler Inhale 2 puffs into the lungs as needed for wheezing or shortness of breath. 04/26/22  Yes [provider]  ALPRAZolam  (XANAX ) 0.5 MG tablet Take 0.5 mg by mouth daily as needed for anxiety. 11/03/23  Yes [provider]  busPIRone  (BUSPAR ) 30 MG tablet Take 30 mg by mouth 2 (two) times daily. 11/07/23  Yes [provider]  cloNIDine  (CATAPRES ) 0.1 MG tablet Take 0.1 mg by mouth 2 (two) times daily. 11/03/23  Yes [provider]  metoprolol  succinate (TOPROL -XL) 25 MG 24 hr tablet Take 25 mg by mouth daily. 10/27/23  Yes [provider]  oxyCODONE -acetaminophen  (PERCOCET/ROXICET) 5-325 MG tablet Take 1 tablet by mouth every 8 (eight) hours as needed for up to 3 days for severe pain (pain score 7-10). 11/18/23 11/21/23 Yes Carie Charity, PA-C  promethazine  (PHENERGAN ) 25 MG tablet Take 1 tablet (25 mg  total) by mouth every 6 (six) hours as needed for nausea or vomiting. 11/18/23  Yes Carie Charity, PA-C  sertraline  (ZOLOFT ) 100 MG tablet Take 200 mg by mouth daily. 11/04/23  Yes [provider]  valsartan (DIOVAN) 80 MG tablet Take 80 mg by mouth daily. 08/23/23  Yes [provider]  WEGOVY 1.7 MG/0.75ML SOAJ Inject 1.7 mg into the skin once a week. Tuesdays 10/13/23  Yes [provider]  cefpodoxime  (VANTIN ) 200 MG tablet Take 1 tablet (200 mg total) by mouth 2 (two) times daily. Patient not taking: Reported on 11/19/2023 11/18/23   Carie Charity, PA-C    Physical Exam: Vitals:   11/19/23 0846 11/19/23 1040 11/19/23 1045 11/19/23 1100  BP:  (!) 153/111 (!) 161/97 (!) 156/91  Pulse:  83 (!) 101 (!) 103  Resp:  16  16  Temp: 98.4 F (36.9 C) 99 F (37.2 C)  98.3 F (36.8 C)  TempSrc: Oral Oral  Oral  SpO2:  91% 100% 96%  Weight:      Height:          Constitutional: NAD, calm, comfortable Eyes: PERRL, lids and conjunctivae normal ENMT: Mucous membranes are  moist. Posterior pharynx clear of any exudate or lesions.Normal dentition.  Neck: normal, supple, no masses, no thyromegaly Respiratory: clear to auscultation bilaterally, no wheezing, no crackles. Normal respiratory effort. No accessory muscle use.  Cardiovascular: Regular rate and rhythm, no murmurs / rubs / gallops. No extremity edema. 2+ pedal pulses. No carotid bruits.  Abdomen: no tenderness, no masses palpated. No hepatosplenomegaly. Bowel sounds positive.  Musculoskeletal: no clubbing / cyanosis. No joint deformity upper and lower extremities. Good ROM, no contractures. Normal muscle tone.  Skin: no rashes, lesions, ulcers. No induration Neurologic: CN 2-12 grossly intact. Sensation intact, DTR normal. Strength 5/5 in all 4.  Psychiatric: Normal judgment and insight. Alert and oriented x 3. Normal mood.    Body mass index is 30.81 kg/m.      Labs on Admission: I have personally reviewed  following labs and imaging studies  CBC: Recent Labs  Lab 11/18/23 1216 11/19/23 0450  WBC 17.2* 11.9*  NEUTROABS 14.4* 10.2*  HGB 12.3 11.9*  HCT 35.3* 33.8*  MCV 97.2 96.8  PLT 201 198   Basic Metabolic Panel: Recent Labs  Lab 11/18/23 1216 11/19/23 0450  NA 134* 133*  K 4.8 3.4*  CL 99 96*  CO2 21* 19*  GLUCOSE 138* 228*  BUN 12 12  CREATININE 0.57 0.68  CALCIUM 9.6 9.2   GFR: Estimated Creatinine Clearance: 93.9 mL/min (by C-G formula based on SCr of 0.68 mg/dL). Liver Function Tests: Recent Labs  Lab 11/18/23 1216  AST 22  ALT 14  ALKPHOS 122  BILITOT 0.7  PROT 7.4  ALBUMIN 3.9   No results for input(s): "LIPASE", "AMYLASE" in the last 168 hours. No results for input(s): "AMMONIA" in the last 168 hours. Coagulation Profile: No results for input(s): "INR", "PROTIME" in the last 168 hours. Cardiac Enzymes: No results for input(s): "CKTOTAL", "CKMB", "CKMBINDEX", "TROPONINI" in the last 168 hours. BNP (last 3 results) No results for input(s): "PROBNP" in the last 8760 hours. HbA1C: No results for input(s): "HGBA1C" in the last 72 hours. CBG: No results for input(s): "GLUCAP" in the last 168 hours. Lipid Profile: No results for input(s): "CHOL", "HDL", "LDLCALC", "TRIG", "CHOLHDL", "LDLDIRECT" in the last 72 hours. Thyroid Function Tests: No results for input(s): "TSH", "T4TOTAL", "FREET4", "T3FREE", "THYROIDAB" in the last 72 hours. Anemia Panel: No results for input(s): "VITAMINB12", "FOLATE", "FERRITIN", "TIBC", "IRON", "RETICCTPCT" in the last 72 hours. Urine analysis:    Component Value Date/Time   COLORURINE YELLOW 11/19/2023 0525   APPEARANCEUR CLEAR 11/19/2023 0525   APPEARANCEUR Cloudy (A) 04/17/2017 1607   LABSPEC 1.025 11/19/2023 0525   PHURINE 6.0 11/19/2023 0525   GLUCOSEU >=500 (A) 11/19/2023 0525   HGBUR TRACE (A) 11/19/2023 0525   BILIRUBINUR NEGATIVE 11/19/2023 0525   BILIRUBINUR Negative 04/17/2017 1607   KETONESUR NEGATIVE  11/19/2023 0525   PROTEINUR 100 (A) 11/19/2023 0525   UROBILINOGEN 0.2 04/22/2011 1656   NITRITE NEGATIVE 11/19/2023 0525   LEUKOCYTESUR NEGATIVE 11/19/2023 0525   Sepsis Labs: !!!!!!!!!!!!!!!!!!!!!!!!!!!!!!!!!!!!!!!!!!!! @LABRCNTIP (procalcitonin:4,lacticidven:4) )No results found for this or any previous visit (from the past 240 hours).   Radiological Exams on Admission: CT Renal Stone Study Result Date: 11/18/2023 CLINICAL DATA:  Abdominal/flank pain, stone suspected EXAM: CT ABDOMEN AND PELVIS WITHOUT CONTRAST TECHNIQUE: Multidetector CT imaging of the abdomen and pelvis was performed following the standard protocol without IV contrast. RADIATION DOSE REDUCTION: This exam was performed according to the departmental dose-optimization program which includes automated exposure control, adjustment of the mA and/or kV according to patient size  and/or use of iterative reconstruction technique. COMPARISON:  Nov 28, 2016 FINDINGS: Of note, the lack of intravenous contrast limits evaluation of the solid organ parenchyma and vascularity. Lower chest: Nodular ground-glass airspace opacity within the posterior basal left lower lobe measuring 3 cm. No pleural effusion. Hepatobiliary: No mass. No radiopaque stones or wall thickening of the gallbladder. No intrahepatic or extrahepatic biliary ductal dilation. Pancreas: No mass or main ductal dilation. No peripancreatic inflammation or fluid collection. Spleen: Normal size. No mass. Adrenals/Urinary Tract: No adrenal masses. No renal mass. The left kidney appears mildly edematous with perinephric stranding. No hydronephrosis or nephrolithiasis. Decompressed urinary bladder without visualized focal abnormality. Stomach/Bowel: The stomach is decompressed without focal abnormality. No small bowel wall thickening or inflammation. No small bowel obstruction. Normal appendix. Vascular/Lymphatic: No aortic aneurysm. No intraabdominal or pelvic lymphadenopathy. Reproductive:  The uterus and ovaries are within normal limits for patient's age. No free pelvic fluid. Other: No pneumoperitoneum, ascites, or mesenteric inflammation. Musculoskeletal: No acute fracture or destructive lesion. Multilevel thoracic osteophytosis. Partially visualized bilateral breast implants. IMPRESSION: 1. Left perinephric and periureteric stranding, which may be due to a recently passed calculus or an ascending urinary tract infection. Correlation with urinalysis recommended. 2. Nodular ground-glass airspace opacity within the posterobasal left lower lobe, measuring 3 cm. While this is likely infectious or inflammatory continued follow-up is recommended, as documented below. Initial follow-up with CT at 6 months is recommended to confirm persistence. If persistent, repeat CT is recommended every 2 years until 5 years of stability has been established. This recommendation follows the consensus statement: Guidelines for Management of Incidental Pulmonary Nodules Detected on CT Images: From the Fleischner Society 2017; Radiology 2017; 284:228-243. Electronically Signed   By: Rance Burrows M.D.   On: 11/18/2023 14:53    Nutritional status  All images have been reviewed by me personally.    Assessment/Plan Principal Problem:   Pyelonephritis of left kidney Active Problems:   History of heroin abuse (HCC)   Depression with anxiety   Gestational hypertension    Acute left-sided pyelonephritis - Initially had leukocytosis, UA and CT scan consistent with pyelonephritis.  Unable to tolerate outpatient p.o. therapy therefore admitted.  Started on IV Rocephin , monitor culture data, gentle hydration  Hypokalemia - As needed repletion  History of polysubstance abuse - Supportive care    DVT prophylaxis: Lovenox  Code Status: Full code code Family Communication: None Consults called: None Admission status:  Inpatient admit patient to MedSurg Status is: Inpatient Inpatient admission to  MedSurg   Time Spent: 65 minutes.  >50% of the time was devoted to discussing the patients care, assessment, plan and disposition with other care givers along with counseling the patient about the risks and benefits of treatment.    Maggie Schooner MD [facility]  If 7PM-7AM, please contact night-coverage   11/19/2023, 12:13 PM

## 2023-11-19 NOTE — ED Triage Notes (Signed)
 Pt state seen yesterday and unable to keep meds down.

## 2023-11-20 LAB — URINE CULTURE
Culture: >=100000 — AB
Culture: NO GROWTH
Special Requests: NORMAL

## 2023-11-21 ENCOUNTER — Telehealth (HOSPITAL_BASED_OUTPATIENT_CLINIC_OR_DEPARTMENT_OTHER): Payer: Self-pay | Admitting: Emergency Medicine

## 2023-11-21 NOTE — Telephone Encounter (Signed)
 Post ED Visit - Positive Culture Follow-up  Culture report reviewed by antimicrobial stewardship pharmacist: [facility] []  Court Distance, Pharm.D. []  Skeet Duke, [address].D., BCPS AQ-ID []  Leslee Rase, Pharm.D., BCPS []  Garland Junk, [address].D., BCPS []  [place], [address].D., BCPS, AAHIVP []  Alcide Aly, Pharm.D., BCPS, AAHIVP []  Jerri Morale, PharmD, BCPS []  Graham Laws, PharmD, BCPS []  Cleda Curly, PharmD, BCPS [x]  Mohammed Andrew, PharmD []  Ballard Levels, PharmD, BCPS []  Ollen Beverage, PharmD  [facility] []  Arlyne Bering, PharmD []  Sherryle Don, PharmD []  Van Gelinas, PharmD []  Delila Felty, Rph []  [place]) Cleora Daft, PharmD []  Augustina Block, PharmD []  Arie Kurtz, PharmD []  Sharlyn Deaner, PharmD []  Agnes Hose, PharmD []  Kendall Pauls, PharmD []  Gladstone Lamer, PharmD []  Armanda Bern, PharmD []  Tera Fellows, PharmD   Positive urine culture Treated with Cefpodoxime , organism sensitive to the same and no further patient follow-up is required at this time.  Lillie Reining 11/21/2023, 12:44 PM

## 2023-12-01 ENCOUNTER — Other Ambulatory Visit (HOSPITAL_BASED_OUTPATIENT_CLINIC_OR_DEPARTMENT_OTHER): Payer: Self-pay

## 2024-01-02 DIAGNOSIS — E66811 Obesity, class 1: Secondary | ICD-10-CM | POA: Diagnosis not present

## 2024-01-02 DIAGNOSIS — F411 Generalized anxiety disorder: Secondary | ICD-10-CM | POA: Diagnosis not present

## 2024-01-02 DIAGNOSIS — I10 Essential (primary) hypertension: Secondary | ICD-10-CM | POA: Diagnosis not present

## 2024-01-02 DIAGNOSIS — Z6831 Body mass index (BMI) 31.0-31.9, adult: Secondary | ICD-10-CM | POA: Diagnosis not present

## 2024-01-02 DIAGNOSIS — E6609 Other obesity due to excess calories: Secondary | ICD-10-CM | POA: Diagnosis not present

## 2024-06-09 DIAGNOSIS — R7303 Prediabetes: Secondary | ICD-10-CM | POA: Diagnosis not present

## 2024-06-09 DIAGNOSIS — Z0000 Encounter for general adult medical examination without abnormal findings: Secondary | ICD-10-CM | POA: Diagnosis not present

## 2024-06-09 DIAGNOSIS — F3342 Major depressive disorder, recurrent, in full remission: Secondary | ICD-10-CM | POA: Diagnosis not present
